# Patient Record
Sex: Female | Born: 2000 | Race: White | Hispanic: No | Marital: Single | Smoking: Never smoker
Health system: Southern US, Community
[De-identification: ages and names within clinical notes are randomized; demographics above are authoritative.]

## PROBLEM LIST (undated history)

## (undated) ENCOUNTER — Inpatient Hospital Stay: Payer: Self-pay

## (undated) DIAGNOSIS — F99 Mental disorder, not otherwise specified: Secondary | ICD-10-CM

## (undated) DIAGNOSIS — G540 Brachial plexus disorders: Secondary | ICD-10-CM

## (undated) DIAGNOSIS — F319 Bipolar disorder, unspecified: Secondary | ICD-10-CM

## (undated) DIAGNOSIS — K589 Irritable bowel syndrome without diarrhea: Secondary | ICD-10-CM

## (undated) DIAGNOSIS — G589 Mononeuropathy, unspecified: Secondary | ICD-10-CM

## (undated) DIAGNOSIS — F419 Anxiety disorder, unspecified: Secondary | ICD-10-CM

## (undated) DIAGNOSIS — F32A Depression, unspecified: Secondary | ICD-10-CM

## (undated) DIAGNOSIS — F329 Major depressive disorder, single episode, unspecified: Secondary | ICD-10-CM

## (undated) DIAGNOSIS — K219 Gastro-esophageal reflux disease without esophagitis: Secondary | ICD-10-CM

## (undated) HISTORY — DX: Anxiety disorder, unspecified: F41.9

## (undated) HISTORY — PX: TONSILLECTOMY: SUR1361

## (undated) HISTORY — DX: Mental disorder, not otherwise specified: F99

## (undated) HISTORY — DX: Depression, unspecified: F32.A

## (undated) HISTORY — PX: FIRST RIB REMOVAL: SHX642

## (undated) HISTORY — DX: Major depressive disorder, single episode, unspecified: F32.9

---
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
## 2019-01-04 ENCOUNTER — Other Ambulatory Visit: Payer: Self-pay

## 2019-01-04 ENCOUNTER — Encounter: Payer: Self-pay | Admitting: Obstetrics and Gynecology

## 2019-01-04 ENCOUNTER — Ambulatory Visit (INDEPENDENT_AMBULATORY_CARE_PROVIDER_SITE_OTHER): Payer: Medicaid Other | Admitting: Obstetrics and Gynecology

## 2019-01-04 VITALS — BP 97/67 | HR 71 | Ht 61.0 in | Wt 155.1 lb

## 2019-01-04 DIAGNOSIS — Z30430 Encounter for insertion of intrauterine contraceptive device: Secondary | ICD-10-CM | POA: Diagnosis not present

## 2019-01-04 NOTE — Addendum Note (Signed)
Addended by: Durwin Glaze on: 01/04/2019 03:19 PM   Modules accepted: Orders

## 2019-01-04 NOTE — Addendum Note (Signed)
Addended by: Jackline Castilla J on: 01/04/2019 03:18 PM   Modules accepted: Orders  

## 2019-01-04 NOTE — Progress Notes (Signed)
HPI:      [name] is a 18 y.o. [phone] who LMP was No LMP recorded.  Subjective:   She presents today for IUD insertion.  She continues to pump and breast-feed.  She has not had a period.    Hx: The following portions of the patient's history were reviewed and updated as appropriate:             She  has a past medical history of Anxiety, Depression, GERD (gastroesophageal reflux disease), Irritable bowel syndrome (IBS) (since childhood), Mental disorder, Pinched nerve (ongoing), and Thoracic outlet syndrome. She does not have any pertinent problems on file. She  has a past surgical history that includes First rib removal; Tonsillectomy; Cesarean section (N/A, 02/09/2017); and Cesarean section (N/A, 11/22/2018). Her family history includes Healthy in her mother; Heart failure in her father; Hypertension in her father; Stroke in her father. She  reports that she has never smoked. She has never used smokeless tobacco. She reports that she does not drink alcohol or use drugs. She has a current medication list which includes the following prescription(s): vitafol gummies. She is allergic to vicodin [hydrocodone-acetaminophen] and amoxicillin.       Review of Systems:  Review of Systems  Constitutional: Denied constitutional symptoms, night sweats, recent illness, fatigue, fever, insomnia and weight loss.  Eyes: Denied eye symptoms, eye pain, photophobia, vision change and visual disturbance.  Ears/Nose/Throat/Neck: Denied ear, nose, throat or neck symptoms, hearing loss, nasal discharge, sinus congestion and sore throat.  Cardiovascular: Denied cardiovascular symptoms, arrhythmia, chest pain/pressure, edema, exercise intolerance, orthopnea and palpitations.  Respiratory: Denied pulmonary symptoms, asthma, pleuritic pain, productive sputum, cough, dyspnea and wheezing.  Gastrointestinal: Denied, gastro-esophageal reflux, melena, nausea and vomiting.  Genitourinary: Denied  genitourinary symptoms including symptomatic vaginal discharge, pelvic relaxation issues, and urinary complaints.  Musculoskeletal: Denied musculoskeletal symptoms, stiffness, swelling, muscle weakness and myalgia.  Dermatologic: Denied dermatology symptoms, rash and scar.  Neurologic: Denied neurology symptoms, dizziness, headache, neck pain and syncope.  Psychiatric: Denied psychiatric symptoms, anxiety and depression.  Endocrine: Denied endocrine symptoms including hot flashes and night sweats.   Meds:   Current Outpatient Medications on File Prior to Visit  Medication Sig Dispense Refill  . Prenatal Vit-Fe Phos-FA-Omega (VITAFOL GUMMIES) 3.33-0.333-34.8 MG CHEW Chew 3 capsules by mouth daily. 90 tablet 10   No current facility-administered medications on file prior to visit.    Objective:     Vitals:   01/04/19 1442  BP: 97/67  Pulse: 71    Physical examination   Pelvic:   Vulva: Normal appearance.  No lesions.  Vagina: No lesions or abnormalities noted.  Support: Normal pelvic support.  Urethra No masses tenderness or scarring.  Meatus Normal size without lesions or prolapse.  Cervix: Normal appearance.  No lesions.  Anus: Normal exam.  No lesions.  Perineum: Normal exam.  No lesions.        Bimanual   Uterus: Normal size.  Non-tender.  Mobile.  Retroverted  Adnexae: No masses.  Non-tender to palpation.  Cul-de-sac: Negative for abnormality.   IUD Procedure Pt has read the booklet and signed the appropriate forms regarding the Mirena IUD.  All of her questions have been answered.   The cervix was cleansed with betadine solution.  After sounding the uterus and noting the position, the IUD was placed in the usual manner without problem.  The string was cut to the appropriate length.  The patient tolerated the procedure well.  Assessment:    [id number] Patient Active Problem List   Diagnosis Date Noted  . Cesarean delivery delivered 11/22/2018  .  Indication for care in labor or delivery 10/24/2018  . Indication for care/intervention related to labor/delivery, antepartum 08/29/2018  . Prior fetal macrosomia, antepartum 06/16/2018  . Rubella non-immune status, antepartum 06/16/2018  . History of cesarean delivery 06/16/2018  . Post-dates pregnancy 02/09/2017  . Pregnancy 12/01/2016  . Bipolar affective disorder in remission (HCC) 08/19/2016  . Severe episode of recurrent major depressive disorder, without psychotic features (HCC)   . Insomnia 06/27/2015  . MDD (major depressive disorder) 06/25/2015     1. Encounter for insertion of mirena IUD       Plan:             F/U  Return in about 4 weeks (around 02/01/2019) for For IUD f/u.  Elonda Husky, M.D. 01/04/2019 3:07 PM

## 2019-01-04 NOTE — Addendum Note (Signed)
Addended by: Durwin Glaze on: 01/04/2019 03:18 PM   Modules accepted: Orders

## 2019-01-12 DIAGNOSIS — R102 Pelvic and perineal pain: Secondary | ICD-10-CM

## 2019-01-12 MED ORDER — TRAMADOL HCL 50 MG PO TABS: 50.0000 mg | ORAL_TABLET | Freq: Four times a day (QID) | ORAL | 0 refills | Status: DC | PRN

## 2019-01-12 NOTE — Telephone Encounter (Signed)
Pt returned called from nurse. Pt is requesting a call back. Please advise

## 2019-02-01 ENCOUNTER — Encounter: Payer: Medicaid Other | Admitting: Obstetrics and Gynecology

## 2019-02-01 ENCOUNTER — Other Ambulatory Visit: Payer: Medicaid Other

## 2019-02-07 ENCOUNTER — Encounter: Payer: Self-pay | Admitting: Surgical

## 2019-02-07 NOTE — Telephone Encounter (Signed)
Pt would like a call from [place].

## 2019-02-23 ENCOUNTER — Ambulatory Visit (INDEPENDENT_AMBULATORY_CARE_PROVIDER_SITE_OTHER): Payer: Medicaid Other | Admitting: Obstetrics and Gynecology

## 2019-02-23 ENCOUNTER — Other Ambulatory Visit: Payer: Self-pay

## 2019-02-23 ENCOUNTER — Encounter: Payer: Self-pay | Admitting: Obstetrics and Gynecology

## 2019-02-23 VITALS — BP 113/75 | HR 86 | Ht 61.0 in | Wt 146.0 lb

## 2019-02-23 DIAGNOSIS — Z30011 Encounter for initial prescription of contraceptive pills: Secondary | ICD-10-CM | POA: Diagnosis not present

## 2019-02-23 DIAGNOSIS — Z3009 Encounter for other general counseling and advice on contraception: Secondary | ICD-10-CM | POA: Diagnosis not present

## 2019-02-23 DIAGNOSIS — Z30432 Encounter for removal of intrauterine contraceptive device: Secondary | ICD-10-CM | POA: Diagnosis not present

## 2019-02-23 MED ORDER — LO LOESTRIN FE 1 MG-10 MCG / 10 MCG PO TABS: 1.0000 | ORAL_TABLET | Freq: Every day | ORAL | 1 refills | Status: DC

## 2019-02-23 NOTE — Progress Notes (Signed)
HPI:      [name] is a 19 y.o. [phone] who LMP was No LMP recorded.  Subjective:   She presents today for IUD removal.  She states she has been bleeding almost every day since insertion.  She is unhappy with this birth control would like to try something else.  She is questions about different methods of birth control.    Hx: The following portions of the patient's history were reviewed and updated as appropriate:             She  has a past medical history of Anxiety, Depression, GERD (gastroesophageal reflux disease), Irritable bowel syndrome (IBS) (since childhood), Mental disorder, Pinched nerve (ongoing), and Thoracic outlet syndrome. She does not have any pertinent problems on file. She  has a past surgical history that includes First rib removal; Tonsillectomy; Cesarean section (N/A, 02/09/2017); and Cesarean section (N/A, 11/22/2018). Her family history includes Healthy in her mother; Heart failure in her father; Hypertension in her father; Stroke in her father. She  reports that she has never smoked. She has never used smokeless tobacco. She reports that she does not drink alcohol or use drugs. She has a current medication list which includes the following prescription(s): lo loestrin fe, vitafol gummies, and tramadol. She is allergic to vicodin [hydrocodone-acetaminophen]; amoxicillin; and tramadol.       Review of Systems:  Review of Systems  Constitutional: Denied constitutional symptoms, night sweats, recent illness, fatigue, fever, insomnia and weight loss.  Eyes: Denied eye symptoms, eye pain, photophobia, vision change and visual disturbance.  Ears/Nose/Throat/Neck: Denied ear, nose, throat or neck symptoms, hearing loss, nasal discharge, sinus congestion and sore throat.  Cardiovascular: Denied cardiovascular symptoms, arrhythmia, chest pain/pressure, edema, exercise intolerance, orthopnea and palpitations.  Respiratory: Denied pulmonary symptoms, asthma,  pleuritic pain, productive sputum, cough, dyspnea and wheezing.  Gastrointestinal: Denied, gastro-esophageal reflux, melena, nausea and vomiting.  Genitourinary: Denied genitourinary symptoms including symptomatic vaginal discharge, pelvic relaxation issues, and urinary complaints.  Musculoskeletal: Denied musculoskeletal symptoms, stiffness, swelling, muscle weakness and myalgia.  Dermatologic: Denied dermatology symptoms, rash and scar.  Neurologic: Denied neurology symptoms, dizziness, headache, neck pain and syncope.  Psychiatric: Denied psychiatric symptoms, anxiety and depression.  Endocrine: Denied endocrine symptoms including hot flashes and night sweats.   Meds:   Current Outpatient Medications on File Prior to Visit  Medication Sig Dispense Refill  . Prenatal Vit-Fe Phos-FA-Omega (VITAFOL GUMMIES) 3.33-0.333-34.8 MG CHEW Chew 3 capsules by mouth daily. 90 tablet 10  . traMADol (ULTRAM) 50 MG tablet Take 1 tablet (50 mg total) by mouth every 6 (six) hours as needed. 20 tablet 0   No current facility-administered medications on file prior to visit.    Objective:     Vitals:   02/23/19 1438  BP: 113/75  Pulse: 86              IUD Removal Strings of IUD identified and grasped.  IUD removed without problem.  Pt tolerated this well.  IUD noted to be intact.     Assessment:    [id number] Patient Active Problem List   Diagnosis Date Noted  . Cesarean delivery delivered 11/22/2018  . Indication for care in labor or delivery 10/24/2018  . Indication for care/intervention related to labor/delivery, antepartum 08/29/2018  . Prior fetal macrosomia, antepartum 06/16/2018  . Rubella non-immune status, antepartum 06/16/2018  . History of cesarean delivery 06/16/2018  . Post-dates pregnancy 02/09/2017  . Pregnancy 12/01/2016  . Bipolar affective disorder in remission (Jaclyn Kelly)  08/19/2016  . Severe episode of recurrent major depressive disorder, without psychotic features (HCC)   .  Insomnia 06/27/2015  . MDD (major depressive disorder) 06/25/2015     1. Encounter for IUD removal   2. Initiation of OCP (BCP)   3. Birth control counseling        Plan:            1.  Birth Control I discussed multiple birth control options and methods with the patient.  The risks and benefits of each were reviewed. OCPs The risks /benefits of OCPs have been explained to the patient in detail.  Product literature has been given to her where appropriate.  I have instructed her in the use of OCPs.  I have explained to the patient that OCPs are not as effective for birth control during the first month of use, and that another form of contraception should be used during this time.  Both first-day start and Sunday start have been explained.  The risks and benefits of each was discussed.  She has been made aware of  the fact that in rare circumstances, other medications may affect the efficacy of OCPs.  I have answered all of her questions, and I believe that she has an understanding of the effectiveness and use of OCPs. She has decided on OCPs as a method of birth control.  We will start lo Loestrin Orders No orders of the defined types were placed in this encounter.    Meds ordered this encounter  Medications  . Norethindrone-Ethinyl Estradiol-Fe Biphas (LO LOESTRIN FE) 1 MG-10 MCG / 10 MCG tablet    Sig: Take 1 tablet by mouth at bedtime for 28 days.    Dispense:  3 Package    Refill:  1      F/U  No follow-ups on file. I spent 21 minutes involved in the care of this patient preparing to see the patient by obtaining and reviewing her medical history (including labs, imaging tests and prior procedures), documenting clinical information in the electronic health record (EHR), counseling and coordinating care plans, writing and sending prescriptions, ordering tests or procedures and directly communicating with the patient by discussing pertinent items from her history and physical exam as  well as detailing my assessment and plan as noted above so that she has an informed understanding.  All of her questions were answered.  Jaclyn Kelly, M.D. 02/23/2019 2:57 PM

## 2019-02-28 ENCOUNTER — Encounter: Payer: Self-pay | Admitting: Surgical

## 2019-03-01 ENCOUNTER — Encounter: Payer: Self-pay | Admitting: Obstetrics and Gynecology

## 2019-03-01 ENCOUNTER — Other Ambulatory Visit: Payer: Self-pay

## 2019-03-01 ENCOUNTER — Ambulatory Visit (INDEPENDENT_AMBULATORY_CARE_PROVIDER_SITE_OTHER): Payer: Medicaid Other | Admitting: Obstetrics and Gynecology

## 2019-03-01 ENCOUNTER — Ambulatory Visit (INDEPENDENT_AMBULATORY_CARE_PROVIDER_SITE_OTHER): Payer: Medicaid Other

## 2019-03-01 VITALS — BP 114/78 | HR 99 | Ht 61.0 in | Wt 145.0 lb

## 2019-03-01 DIAGNOSIS — N941 Unspecified dyspareunia: Secondary | ICD-10-CM

## 2019-03-01 DIAGNOSIS — R102 Pelvic and perineal pain: Secondary | ICD-10-CM

## 2019-03-01 NOTE — Progress Notes (Signed)
HPI:      [name] is a 19 y.o. [phone] who LMP was No LMP recorded. (Menstrual status: Oral contraceptives).  Subjective:   She presents today stating that she continues to experience pelvic pain especially left-sided.  It is especially bad with intercourse.  She is taking OCPs as directed and has noted some spotting but no heavy bleeding.  Her main concern is with her pelvic pain.    Hx: The following portions of the patient's history were reviewed and updated as appropriate:             She  has a past medical history of Anxiety, Depression, GERD (gastroesophageal reflux disease), Irritable bowel syndrome (IBS) (since childhood), Mental disorder, Pinched nerve (ongoing), and Thoracic outlet syndrome. She does not have any pertinent problems on file. She  has a past surgical history that includes First rib removal; Tonsillectomy; Cesarean section (N/A, 02/09/2017); and Cesarean section (N/A, 11/22/2018). Her family history includes Healthy in her mother; Heart failure in her father; Hypertension in her father; Stroke in her father. She  reports that she has never smoked. She has never used smokeless tobacco. She reports that she does not drink alcohol or use drugs. She has a current medication list which includes the following prescription(s): lo loestrin fe. She is allergic to vicodin [hydrocodone-acetaminophen]; amoxicillin; and tramadol.       Review of Systems:  Review of Systems  Constitutional: Denied constitutional symptoms, night sweats, recent illness, fatigue, fever, insomnia and weight loss.  Eyes: Denied eye symptoms, eye pain, photophobia, vision change and visual disturbance.  Ears/Nose/Throat/Neck: Denied ear, nose, throat or neck symptoms, hearing loss, nasal discharge, sinus congestion and sore throat.  Cardiovascular: Denied cardiovascular symptoms, arrhythmia, chest pain/pressure, edema, exercise intolerance, orthopnea and palpitations.  Respiratory: Denied  pulmonary symptoms, asthma, pleuritic pain, productive sputum, cough, dyspnea and wheezing.  Gastrointestinal: Denied, gastro-esophageal reflux, melena, nausea and vomiting.  Genitourinary: See HPI for additional information.  Musculoskeletal: Denied musculoskeletal symptoms, stiffness, swelling, muscle weakness and myalgia.  Dermatologic: Denied dermatology symptoms, rash and scar.  Neurologic: Denied neurology symptoms, dizziness, headache, neck pain and syncope.  Psychiatric: Denied psychiatric symptoms, anxiety and depression.  Endocrine: Denied endocrine symptoms including hot flashes and night sweats.   Meds:   Current Outpatient Medications on File Prior to Visit  Medication Sig Dispense Refill  . Norethindrone-Ethinyl Estradiol-Fe Biphas (LO LOESTRIN FE) 1 MG-10 MCG / 10 MCG tablet Take 1 tablet by mouth at bedtime for 28 days. 3 Package 1   No current facility-administered medications on file prior to visit.    Objective:     Vitals:   03/01/19 1418  BP: 114/78  Pulse: 99                Assessment:    [id number] Patient Active Problem List   Diagnosis Date Noted  . Cesarean delivery delivered 11/22/2018  . Indication for care in labor or delivery 10/24/2018  . Indication for care/intervention related to labor/delivery, antepartum 08/29/2018  . Prior fetal macrosomia, antepartum 06/16/2018  . Rubella non-immune status, antepartum 06/16/2018  . History of cesarean delivery 06/16/2018  . Post-dates pregnancy 02/09/2017  . Pregnancy 12/01/2016  . Bipolar affective disorder in remission (HCC) 08/19/2016  . Severe episode of recurrent major depressive disorder, without psychotic features (HCC)   . Insomnia 06/27/2015  . MDD (major depressive disorder) 06/25/2015     1. Pelvic pain in female   2. Dyspareunia in female     Patient  has pelvic pain but mostly pain with intercourse.  Left-sided.   Plan:            1.  Ultrasound to rule out ovarian cyst or other  pelvic pathology.  Patient will continue OCPs. Orders Orders Placed This Encounter  Procedures  . US PELVIS (TRANSABDOMINAL ONLY)    No orders of the defined types were placed in this encounter.     F/U  No follow-ups on file. I spent 21 minutes involved in the care of this patient preparing to see the patient by obtaining and reviewing her medical history (including labs, imaging tests and prior procedures), documenting clinical information in the electronic health record (EHR), counseling and coordinating care plans, writing and sending prescriptions, ordering tests or procedures and directly communicating with the patient by discussing pertinent items from her history and physical exam as well as detailing my assessment and plan as noted above so that she has an informed understanding.  All of her questions were answered.  Finis Bud, M.D. 03/01/2019 2:43 PM

## 2019-03-03 ENCOUNTER — Encounter: Payer: Medicaid Other | Admitting: Obstetrics and Gynecology

## 2019-03-03 ENCOUNTER — Other Ambulatory Visit: Payer: Medicaid Other

## 2019-06-15 ENCOUNTER — Encounter: Payer: Self-pay | Admitting: Obstetrics and Gynecology

## 2019-06-15 ENCOUNTER — Other Ambulatory Visit: Payer: Self-pay

## 2019-06-15 ENCOUNTER — Ambulatory Visit (INDEPENDENT_AMBULATORY_CARE_PROVIDER_SITE_OTHER): Payer: Medicaid Other | Admitting: Obstetrics and Gynecology

## 2019-06-15 VITALS — BP 105/67 | HR 105 | Ht 61.0 in | Wt 133.7 lb

## 2019-06-15 DIAGNOSIS — O09299 Supervision of pregnancy with other poor reproductive or obstetric history, unspecified trimester: Secondary | ICD-10-CM

## 2019-06-15 DIAGNOSIS — N912 Amenorrhea, unspecified: Secondary | ICD-10-CM

## 2019-06-15 DIAGNOSIS — O34219 Maternal care for unspecified type scar from previous cesarean delivery: Secondary | ICD-10-CM

## 2019-06-15 LAB — POCT URINE PREGNANCY: Preg Test, Ur: POSITIVE — AB

## 2019-06-15 MED ORDER — VITAFOL GUMMIES 3.33-0.333-34.8 MG PO CHEW: CHEWABLE_TABLET | ORAL | 11 refills | Status: DC

## 2019-06-15 MED ORDER — DOXYLAMINE-PYRIDOXINE 10-10 MG PO TBEC: 2.0000 | DELAYED_RELEASE_TABLET | Freq: Every day | ORAL | 5 refills | Status: DC

## 2019-06-15 NOTE — Progress Notes (Signed)
HPI:      [name] is a 19 y.o. [phone] who LMP was Patient's last menstrual period was 05/16/2019.  Subjective:   She presents today after missing her menstrual period. She is approximately 4 weeks estimated gestational age. She has not yet begun prenatal vitamins. She states that she went off of OCPs purposefully to get pregnant. She has occasional nausea without vomiting. She says her appetite is decreased. Patient has a history of 2 prior cesarean deliveries. She says that she is considering an attempt at "having the baby natural this time". Of significant note patient had her first cesarean delivery for nonreassuring fetal heart rate tracing and her second cesarean delivery was a scheduled repeat. Her largest baby weighed 9 pounds 10 ounces.    Hx: The following portions of the patient's history were reviewed and updated as appropriate:             She  has a past medical history of Anxiety, Depression, GERD (gastroesophageal reflux disease), Irritable bowel syndrome (IBS) (since childhood), Mental disorder, Pinched nerve (ongoing), and Thoracic outlet syndrome. She does not have any pertinent problems on file. She  has a past surgical history that includes First rib removal; Tonsillectomy; Cesarean section (N/A, 02/09/2017); and Cesarean section (N/A, 11/22/2018). Her family history includes Healthy in her mother; Heart failure in her father; Hypertension in her father; Stroke in her father. She  reports that she has never smoked. She has never used smokeless tobacco. She reports that she does not drink alcohol or use drugs. She has a current medication list which includes the following prescription(s): doxylamine-pyridoxine and vitafol gummies. She is allergic to vicodin [hydrocodone-acetaminophen]; amoxicillin; and tramadol.       Review of Systems:  Review of Systems  Constitutional: Denied constitutional symptoms, night sweats, recent illness, fatigue, fever, insomnia and  weight loss.  Eyes: Denied eye symptoms, eye pain, photophobia, vision change and visual disturbance.  Ears/Nose/Throat/Neck: Denied ear, nose, throat or neck symptoms, hearing loss, nasal discharge, sinus congestion and sore throat.  Cardiovascular: Denied cardiovascular symptoms, arrhythmia, chest pain/pressure, edema, exercise intolerance, orthopnea and palpitations.  Respiratory: Denied pulmonary symptoms, asthma, pleuritic pain, productive sputum, cough, dyspnea and wheezing.  Gastrointestinal: Denied, gastro-esophageal reflux, melena, nausea and vomiting.  Genitourinary: Denied genitourinary symptoms including symptomatic vaginal discharge, pelvic relaxation issues, and urinary complaints.  Musculoskeletal: Denied musculoskeletal symptoms, stiffness, swelling, muscle weakness and myalgia.  Dermatologic: Denied dermatology symptoms, rash and scar.  Neurologic: Denied neurology symptoms, dizziness, headache, neck pain and syncope.  Psychiatric: Denied psychiatric symptoms, anxiety and depression.  Endocrine: Denied endocrine symptoms including hot flashes and night sweats.   Meds:   No current outpatient medications on file prior to visit.   No current facility-administered medications on file prior to visit.    Objective:     Vitals:   06/15/19 1424  BP: 105/67  Pulse: (!) 105              Urinary pregnancy test positive  Assessment:    [id number] Patient Active Problem List   Diagnosis Date Noted  . Cesarean delivery delivered 11/22/2018  . Indication for care in labor or delivery 10/24/2018  . Indication for care/intervention related to labor/delivery, antepartum 08/29/2018  . Prior fetal macrosomia, antepartum 06/16/2018  . Rubella non-immune status, antepartum 06/16/2018  . History of cesarean delivery 06/16/2018  . Post-dates pregnancy 02/09/2017  . Pregnancy 12/01/2016  . Bipolar affective disorder in remission ([place]) 08/19/2016  . Severe episode of recurrent major  depressive disorder, without psychotic features (HCC)   . Insomnia 06/27/2015  . MDD (major depressive disorder) 06/25/2015     1. Amenorrhea   2. Previous cesarean delivery, antepartum   3. History of macrosomia in infant in prior pregnancy, currently pregnant        Plan:            Prenatal Plan 1.  The patient was given prenatal literature. 2.  She was continued on prenatal vitamins. 3.  A prenatal lab panel was ordered or drawn. 4.  An ultrasound was ordered to better determine an EDC. 5.  A nurse visit was scheduled. 6.  Genetic testing and testing for other inheritable conditions discussed in detail. She will decide in the future whether to have these labs performed. 7.  A general overview of pregnancy testing, visit schedule, ultrasound schedule, and prenatal care was discussed. 8.  COVID and its risks associated with pregnancy, prevention by limiting exposure and use of masks, as well as the risks and benefits of vaccination during pregnancy were discussed in detail.  [facility] policy regarding office and hospital visitation and testing was explained. 9.  Benefits of breast-feeding discussed in detail including both maternal and infant benefits. Ready Set Baby website discussed. 10. TOLAC discussed-risk benefits reviewed patient will continue to consider this option throughout her pregnancy. Possible ultrasound later in pregnancy for fetal size which could affect this decision. 11. Because of her history of macrosomia consider 18-week 1 hour GCT.   Orders Orders Placed This Encounter  Procedures  . POCT urine pregnancy     Meds ordered this encounter  Medications  . Prenatal Vit-Fe Phos-FA-Omega (VITAFOL GUMMIES) 3.33-0.333-34.8 MG CHEW    Sig: Take three gummies every morning.    Dispense:  90 tablet    Refill:  11  . Doxylamine-Pyridoxine (DICLEGIS) 10-10 MG TBEC    Sig: Take 2 tablets by mouth at bedtime. If symptoms persist, add one tablet in the morning and one in  the afternoon    Dispense:  100 tablet    Refill:  5      F/U  Return in about 4 weeks (around 07/13/2019).  Elonda Husky, M.D. 06/15/2019 2:49 PM

## 2019-06-22 ENCOUNTER — Other Ambulatory Visit: Payer: Self-pay

## 2019-06-22 ENCOUNTER — Emergency Department
Admission: EM | Admit: 2019-06-22 | Discharge: 2019-06-22 | Discharge status: Against Medical Advice | Payer: Medicaid Other | Attending: Emergency Medicine | Admitting: Emergency Medicine

## 2019-06-22 ENCOUNTER — Encounter: Payer: Self-pay | Admitting: *Deleted

## 2019-06-22 DIAGNOSIS — Z5321 Procedure and treatment not carried out due to patient leaving prior to being seen by health care provider: Secondary | ICD-10-CM | POA: Diagnosis not present

## 2019-06-22 DIAGNOSIS — R1030 Lower abdominal pain, unspecified: Secondary | ICD-10-CM | POA: Insufficient documentation

## 2019-06-22 LAB — COMPREHENSIVE METABOLIC PANEL
ALT: 15 U/L (ref 0–44)
AST: 15 U/L (ref 15–41)
Albumin: 4.4 g/dL (ref 3.5–5.0)
Alkaline Phosphatase: 76 U/L (ref 38–126)
Anion gap: 8 (ref 5–15)
BUN: 9 mg/dL (ref 6–20)
CO2: 24 mmol/L (ref 22–32)
Calcium: 8.8 mg/dL — ABNORMAL LOW (ref 8.9–10.3)
Chloride: 106 mmol/L (ref 98–111)
Creatinine, Ser: 0.65 mg/dL (ref 0.44–1.00)
GFR calc Af Amer: >60 mL/min (ref >60)
GFR calc non Af Amer: >60 mL/min (ref >60)
Glucose, Bld: 74 mg/dL (ref 70–99)
Potassium: 3.6 mmol/L (ref 3.5–5.1)
Sodium: 138 mmol/L (ref 135–145)
Total Bilirubin: 0.5 mg/dL (ref 0.3–1.2)
Total Protein: 7.1 g/dL (ref 6.5–8.1)

## 2019-06-22 LAB — CBC
HCT: 39.9 % (ref 36.0–46.0)
Hemoglobin: 13.1 g/dL (ref 12.0–15.0)
MCH: 27.2 pg (ref 26.0–34.0)
MCHC: 32.8 g/dL (ref 30.0–36.0)
MCV: 83 fL (ref 80.0–100.0)
Platelets: 205 10*3/uL (ref 150–400)
RBC: 4.81 MIL/uL (ref 3.87–5.11)
RDW: 13.2 % (ref 11.5–15.5)
WBC: 10.7 10*3/uL — ABNORMAL HIGH (ref 4.0–10.5)
nRBC: 0 % (ref 0.0–0.2)

## 2019-06-22 LAB — URINALYSIS, COMPLETE (UACMP) WITH MICROSCOPIC
Bacteria, UA: NONE SEEN
Bilirubin Urine: NEGATIVE
Glucose, UA: NEGATIVE mg/dL
Hgb urine dipstick: NEGATIVE
Ketones, ur: NEGATIVE mg/dL
Nitrite: NEGATIVE
Protein, ur: 30 mg/dL — AB
Specific Gravity, Urine: 1.036 — ABNORMAL HIGH (ref 1.005–1.030)
pH: 5 (ref 5.0–8.0)

## 2019-06-22 LAB — HCG, QUANTITATIVE, PREGNANCY: hCG, Beta Chain, Quant, S: 11423 m[IU]/mL — ABNORMAL HIGH (ref <5)

## 2019-06-22 LAB — LIPASE, BLOOD: Lipase: 26 U/L (ref 11–51)

## 2019-06-22 NOTE — ED Triage Notes (Signed)
Pt has lower abd pain and lower back pain  Pt is apporx [redacted] weeks pregnant. No vag bleeding.  Denies urinary sx.  Pt has nausea.  Pt alert  Speech clear.

## 2019-06-29 ENCOUNTER — Ambulatory Visit (INDEPENDENT_AMBULATORY_CARE_PROVIDER_SITE_OTHER): Payer: Medicaid Other

## 2019-06-29 ENCOUNTER — Other Ambulatory Visit: Payer: Self-pay | Admitting: Obstetrics and Gynecology

## 2019-06-29 ENCOUNTER — Other Ambulatory Visit: Payer: Self-pay

## 2019-06-29 DIAGNOSIS — Z789 Other specified health status: Secondary | ICD-10-CM

## 2019-06-29 DIAGNOSIS — Z3A01 Less than 8 weeks gestation of pregnancy: Secondary | ICD-10-CM

## 2019-07-08 ENCOUNTER — Other Ambulatory Visit: Payer: Self-pay

## 2019-07-08 ENCOUNTER — Ambulatory Visit (INDEPENDENT_AMBULATORY_CARE_PROVIDER_SITE_OTHER): Payer: Medicaid Other | Admitting: Surgical

## 2019-07-08 VITALS — BP 118/83 | HR 85 | Ht 61.0 in | Wt 129.3 lb

## 2019-07-08 DIAGNOSIS — Z3491 Encounter for supervision of normal pregnancy, unspecified, first trimester: Secondary | ICD-10-CM | POA: Diagnosis not present

## 2019-07-08 MED ORDER — ONDANSETRON 4 MG PO TBDP: 4.0000 mg | ORAL_TABLET | Freq: Four times a day (QID) | ORAL | 0 refills | Status: DC | PRN

## 2019-07-08 NOTE — Progress Notes (Signed)
Jaclyn Kelly presents for NOB nurse interview visit. Pregnancy confirmation done 06/15/19. G5. [id number]. Pregnancy education material explained and given. 1 cats in home. NOB labs ordered. TSH/HbgA1c ordered due to BMI 30 or greater. Sickle cell ordered due to patient's race. HIV labs and drug screen were explained and ordered. PNV encouraged. Genetic screening options discussed. Genetic testing: Unsure. Patient may discuss with the provider. Patient to follow up with provider on 08/10/19  weeks for NOB physical. All questions answered.

## 2019-07-09 LAB — URINALYSIS, ROUTINE W REFLEX MICROSCOPIC
Bilirubin, UA: NEGATIVE
Glucose, UA: NEGATIVE
Nitrite, UA: NEGATIVE
Specific Gravity, UA: >=1.03 — AB (ref 1.005–1.030)
Urobilinogen, Ur: 1 mg/dL (ref 0.2–1.0)
pH, UA: 5.5 (ref 5.0–7.5)

## 2019-07-09 LAB — MICROSCOPIC EXAMINATION: Epithelial Cells (non renal): >10 /hpf — AB (ref 0–10)

## 2019-07-09 LAB — HEPATITIS B SURFACE ANTIGEN: Hepatitis B Surface Ag: NEGATIVE

## 2019-07-09 LAB — RPR: RPR Ser Ql: NONREACTIVE

## 2019-07-09 LAB — ABO AND RH: Rh Factor: POSITIVE

## 2019-07-09 LAB — RUBELLA SCREEN: Rubella Antibodies, IGG: 2.55 index (ref >0.99)

## 2019-07-09 LAB — HIV ANTIBODY (ROUTINE TESTING W REFLEX): HIV Screen 4th Generation wRfx: NONREACTIVE

## 2019-07-09 LAB — VARICELLA ZOSTER ANTIBODY, IGG: Varicella zoster IgG: 770 index (ref >165)

## 2019-07-09 LAB — ANTIBODY SCREEN: Antibody Screen: NEGATIVE

## 2019-07-11 LAB — GC/CHLAMYDIA PROBE AMP
Chlamydia trachomatis, NAA: NEGATIVE
Neisseria Gonorrhoeae by PCR: NEGATIVE

## 2019-07-12 LAB — URINE CULTURE, OB REFLEX

## 2019-07-12 LAB — CULTURE, OB URINE

## 2019-07-14 ENCOUNTER — Other Ambulatory Visit: Payer: Self-pay | Admitting: Surgical

## 2019-07-14 MED ORDER — NITROFURANTOIN MONOHYD MACRO 100 MG PO CAPS
100.0000 mg | ORAL_CAPSULE | Freq: Two times a day (BID) | ORAL | 0 refills | Status: DC
Start: 2019-07-14 — End: 2019-08-05

## 2019-07-20 LAB — MONITOR DRUG PROFILE 14(MW)
Amphetamine Scrn, Ur: NEGATIVE ng/mL
BARBITURATE SCREEN URINE: NEGATIVE ng/mL
BENZODIAZEPINE SCREEN, URINE: NEGATIVE ng/mL
Buprenorphine, Urine: NEGATIVE ng/mL
Cocaine (Metab) Scrn, Ur: NEGATIVE ng/mL
Creatinine(Crt), U: 472.7 mg/dL — ABNORMAL HIGH (ref 20.0–300.0)
Fentanyl, Urine: NEGATIVE pg/mL
Meperidine Screen, Urine: NEGATIVE ng/mL
Methadone Screen, Urine: NEGATIVE ng/mL
Ph of Urine: 6 (ref 4.5–8.9)
Phencyclidine Qn, Ur: NEGATIVE ng/mL
Propoxyphene Scrn, Ur: NEGATIVE ng/mL
SPECIFIC GRAVITY: 1.02
Tramadol Screen, Urine: NEGATIVE ng/mL

## 2019-07-20 LAB — NICOTINE SCREEN, URINE: Cotinine Ql Scrn, Ur: POSITIVE ng/mL — AB

## 2019-07-20 LAB — OPIATES CONFIRMATION, URINE
Codeine: NEGATIVE
HYDROCODONE CONFIRM: 392 ng/mL
Hydrocodone: POSITIVE — AB
Hydromorphone Confirm: 308 ng/mL
Hydromorphone: POSITIVE — AB
Morphine: NEGATIVE
Opiates: POSITIVE ng/mL — AB

## 2019-07-20 LAB — OXYCODONE/OXYMORPHONE CONFIRM
OXYCODONE/OXYMORPH: POSITIVE — AB
OXYCODONE: >3000 ng/mL
OXYCODONE: POSITIVE — AB
OXYMORPHONE CONFIRM: >3000 ng/mL
OXYMORPHONE: POSITIVE — AB

## 2019-07-20 LAB — CANNABINOID (GC/MS), URINE
Cannabinoid: POSITIVE — AB
Carboxy THC (GC/MS): 23 ng/mL

## 2019-08-05 ENCOUNTER — Other Ambulatory Visit: Payer: Self-pay

## 2019-08-05 ENCOUNTER — Ambulatory Visit (INDEPENDENT_AMBULATORY_CARE_PROVIDER_SITE_OTHER): Payer: Medicaid Other | Admitting: Obstetrics and Gynecology

## 2019-08-05 ENCOUNTER — Encounter: Payer: Self-pay | Admitting: Obstetrics and Gynecology

## 2019-08-05 VITALS — BP 105/69 | HR 71 | Ht 61.0 in | Wt 125.5 lb

## 2019-08-05 DIAGNOSIS — O0380 Unspecified complication following complete or unspecified spontaneous abortion: Secondary | ICD-10-CM

## 2019-08-05 DIAGNOSIS — O036 Delayed or excessive hemorrhage following complete or unspecified spontaneous abortion: Secondary | ICD-10-CM | POA: Diagnosis not present

## 2019-08-05 DIAGNOSIS — R102 Pelvic and perineal pain: Secondary | ICD-10-CM

## 2019-08-05 DIAGNOSIS — O0330 Unspecified complication following incomplete spontaneous abortion: Secondary | ICD-10-CM | POA: Diagnosis not present

## 2019-08-05 DIAGNOSIS — R319 Hematuria, unspecified: Secondary | ICD-10-CM

## 2019-08-05 DIAGNOSIS — N3001 Acute cystitis with hematuria: Secondary | ICD-10-CM | POA: Diagnosis not present

## 2019-08-05 LAB — POCT URINALYSIS DIPSTICK
Bilirubin, UA: NEGATIVE
Glucose, UA: NEGATIVE
Ketones, UA: NEGATIVE
Leukocytes, UA: NEGATIVE
Nitrite, UA: NEGATIVE
Protein, UA: NEGATIVE
Spec Grav, UA: 1.01 (ref 1.010–1.025)
Urobilinogen, UA: 0.2 E.U./dL
pH, UA: 6.5 (ref 5.0–8.0)

## 2019-08-05 MED ORDER — NITROFURANTOIN MONOHYD MACRO 100 MG PO CAPS: 100.0000 mg | ORAL_CAPSULE | Freq: Two times a day (BID) | ORAL | 1 refills | Status: DC

## 2019-08-05 MED ORDER — CEPHALEXIN 500 MG PO CAPS
500.0000 mg | ORAL_CAPSULE | Freq: Four times a day (QID) | ORAL | 2 refills | Status: DC
Start: 2019-08-05 — End: 2019-09-30

## 2019-08-05 MED ORDER — DOXYCYCLINE HYCLATE 100 MG PO CAPS: 100.0000 mg | ORAL_CAPSULE | Freq: Two times a day (BID) | ORAL | 0 refills | Status: AC

## 2019-08-05 NOTE — Progress Notes (Signed)
HPI:      [name] is a 19 y.o. [id number] who LMP was Patient's last menstrual period was 05/16/2019.  Subjective:   She presents today with complaint of persistent vaginal bleeding with small clots (quarter to half dollar size 3 times per day) since her abortion 2 weeks ago.  She states that the first few days she had bleeding afterward but on about the fourth day her bleeding became very heavy and she passed a large clot the size of her hand.  Since that time her bleeding has decreased but she continues to experience passage of quarter size clots.  Passage of clots is accompanied by midline abdominal cramping.  Patient is also states that she has only been able to urinate 1-2 times per day and it is not a normal stream for her. Patient resumed intercourse after her abortion.    Hx: The following portions of the patient's history were reviewed and updated as appropriate:             She  has a past medical history of Anxiety, Depression, GERD (gastroesophageal reflux disease), Irritable bowel syndrome (IBS) (since childhood), Mental disorder, Pinched nerve (ongoing), and Thoracic outlet syndrome. She does not have any pertinent problems on file. She  has a past surgical history that includes First rib removal; Tonsillectomy; Cesarean section (N/A, 02/09/2017); and Cesarean section (N/A, 11/22/2018). Her family history includes Healthy in her mother; Heart failure in her father; Hypertension in her father; Stroke in her father. She  reports that she has never smoked. She has never used smokeless tobacco. She reports that she does not drink alcohol and does not use drugs. She has a current medication list which includes the following prescription(s): suboxone, cephalexin, doxycycline, and nitrofurantoin (macrocrystal-monohydrate). She is allergic to vicodin [hydrocodone-acetaminophen], amoxicillin, and tramadol.       Review of Systems:  Review of Systems  Constitutional: Denied  constitutional symptoms, night sweats, recent illness, fatigue, fever, insomnia and weight loss.  Eyes: Denied eye symptoms, eye pain, photophobia, vision change and visual disturbance.  Ears/Nose/Throat/Neck: Denied ear, nose, throat or neck symptoms, hearing loss, nasal discharge, sinus congestion and sore throat.  Cardiovascular: Denied cardiovascular symptoms, arrhythmia, chest pain/pressure, edema, exercise intolerance, orthopnea and palpitations.  Respiratory: Denied pulmonary symptoms, asthma, pleuritic pain, productive sputum, cough, dyspnea and wheezing.  Gastrointestinal: Denied, gastro-esophageal reflux, melena, nausea and vomiting.  Genitourinary: See HPI for additional information.  Musculoskeletal: Denied musculoskeletal symptoms, stiffness, swelling, muscle weakness and myalgia.  Dermatologic: Denied dermatology symptoms, rash and scar.  Neurologic: Denied neurology symptoms, dizziness, headache, neck pain and syncope.  Psychiatric: Denied psychiatric symptoms, anxiety and depression.  Endocrine: Denied endocrine symptoms including hot flashes and night sweats.   Meds:   Current Outpatient Medications on File Prior to Visit  Medication Sig Dispense Refill  . SUBOXONE 8-2 MG FILM Place 1 Film under the tongue daily.     No current facility-administered medications on file prior to visit.    Objective:     Vitals:   08/05/19 1103  BP: 105/69  Pulse: 71              Physical examination   Pelvic:   Vulva: Normal appearance.  No lesions.  Vagina: No lesions or abnormalities noted.  Minimal blood noted in the vagina  Support: Normal pelvic support.  Urethra No masses tenderness or scarring.  Meatus Normal size without lesions or prolapse.  Cervix: Normal appearance.  No lesions.  Minimal blood coming from  the cervical os.  Os is closed.  Anus: Normal exam.  No lesions.  Perineum: Normal exam.  No lesions.        Bimanual   Uterus: Normal size.  Moderately tender to  palpation.  mobile.  AV.  Adnexae: No masses.  Non-tender to palpation.  Cul-de-sac: Negative for abnormality.   UA performed but only positive for blood-very little urine present not enough to send for culture. (Essentially undiagnostic)  Assessment:    [id number] Patient Active Problem List   Diagnosis Date Noted  . Cesarean delivery delivered 11/22/2018  . Indication for care in labor or delivery 10/24/2018  . Indication for care/intervention related to labor/delivery, antepartum 08/29/2018  . Prior fetal macrosomia, antepartum 06/16/2018  . Rubella non-immune status, antepartum 06/16/2018  . History of cesarean delivery 06/16/2018  . Post-dates pregnancy 02/09/2017  . Pregnancy 12/01/2016  . Bipolar affective disorder in remission (HCC) 08/19/2016  . Severe episode of recurrent major depressive disorder, without psychotic features (HCC)   . Insomnia 06/27/2015  . MDD (major depressive disorder) 06/25/2015     1. Post-abortion complication   2. Hematuria, unspecified type   3. Pelvic pain in female   4. Acute cystitis with hematuria     I think based on the patient's symptoms of hesitancy with urination and her low-grade temperature she may have a bladder infection.  We will presumptively treat with antibiotics  With some uterine tenderness and more than expected bleeding status post abortion endometritis is a possibility.   Plan:            1.  We will presumptively treat for endometritis using Keflex and doxycycline.    2.  Treat for UTI with Macrobid  3.  As ultrasound not readily available and cervical os closed with uterus small will presume no retained products.  If patient continues to be symptomatic through the weekend despite antibiotics will perform ultrasound early next week and possible D&E as needed.  Orders Orders Placed This Encounter  Procedures  . POCT urinalysis dipstick     Meds ordered this encounter  Medications  . cephALEXin (KEFLEX) 500 MG  capsule    Sig: Take 1 capsule (500 mg total) by mouth 4 (four) times daily.    Dispense:  28 capsule    Refill:  2  . doxycycline (VIBRAMYCIN) 100 MG capsule    Sig: Take 1 capsule (100 mg total) by mouth 2 (two) times daily for 7 days.    Dispense:  14 capsule    Refill:  0  . nitrofurantoin, macrocrystal-monohydrate, (MACROBID) 100 MG capsule    Sig: Take 1 capsule (100 mg total) by mouth 2 (two) times daily.    Dispense:  14 capsule    Refill:  1      F/U  Return for Pt to contact us if symptoms worsen. I spent 25 minutes involved in the care of this patient preparing to see the patient by obtaining and reviewing her medical history (including labs, imaging tests and prior procedures), documenting clinical information in the electronic health record (EHR), counseling and coordinating care plans, writing and sending prescriptions, ordering tests or procedures and directly communicating with the patient by discussing pertinent items from her history and physical exam as well as detailing my assessment and plan as noted above so that she has an informed understanding.  All of her questions were answered.  Elonda Husky, M.D. 08/05/2019 12:03 PM

## 2019-08-08 ENCOUNTER — Other Ambulatory Visit: Payer: Self-pay | Admitting: Surgical

## 2019-08-08 DIAGNOSIS — O0380 Unspecified complication following complete or unspecified spontaneous abortion: Secondary | ICD-10-CM

## 2019-08-09 ENCOUNTER — Ambulatory Visit (INDEPENDENT_AMBULATORY_CARE_PROVIDER_SITE_OTHER): Payer: Medicaid Other | Admitting: Obstetrics and Gynecology

## 2019-08-09 ENCOUNTER — Ambulatory Visit (INDEPENDENT_AMBULATORY_CARE_PROVIDER_SITE_OTHER): Payer: Medicaid Other

## 2019-08-09 ENCOUNTER — Encounter: Payer: Self-pay | Admitting: Obstetrics and Gynecology

## 2019-08-09 VITALS — BP 109/73 | HR 94 | Ht 61.0 in | Wt 124.1 lb

## 2019-08-09 DIAGNOSIS — O0380 Unspecified complication following complete or unspecified spontaneous abortion: Secondary | ICD-10-CM

## 2019-08-09 DIAGNOSIS — O034 Incomplete spontaneous abortion without complication: Secondary | ICD-10-CM | POA: Diagnosis not present

## 2019-08-09 DIAGNOSIS — Z01818 Encounter for other preprocedural examination: Secondary | ICD-10-CM | POA: Diagnosis not present

## 2019-08-09 NOTE — H&P (View-Only) (Signed)
PRE-OPERATIVE HISTORY AND PHYSICAL EXAM  PCP:  Patient, No Pcp Per Subjective:   HPI:  Jaclyn Kelly is a 19 y.o. [id number].  Patient's last menstrual period was 05/16/2019.  She presents today for a pre-op discussion and PE.  She has the following symptoms: Continues to have irregular and intermittent bleeding after abortion.  She took her antibiotics and she no longer has symptoms of bladder infection.  Review of Systems:   Constitutional: Denied constitutional symptoms, night sweats, recent illness, fatigue, fever, insomnia and weight loss.  Eyes: Denied eye symptoms, eye pain, photophobia, vision change and visual disturbance.  Ears/Nose/Throat/Neck: Denied ear, nose, throat or neck symptoms, hearing loss, nasal discharge, sinus congestion and sore throat.  Cardiovascular: Denied cardiovascular symptoms, arrhythmia, chest pain/pressure, edema, exercise intolerance, orthopnea and palpitations.  Respiratory: Denied pulmonary symptoms, asthma, pleuritic pain, productive sputum, cough, dyspnea and wheezing.  Gastrointestinal: Denied, gastro-esophageal reflux, melena, nausea and vomiting.  Genitourinary: See HPI for additional information.  Musculoskeletal: Denied musculoskeletal symptoms, stiffness, swelling, muscle weakness and myalgia.  Dermatologic: Denied dermatology symptoms, rash and scar.  Neurologic: Denied neurology symptoms, dizziness, headache, neck pain and syncope.  Psychiatric: Denied psychiatric symptoms, anxiety and depression.  Endocrine: Denied endocrine symptoms including hot flashes and night sweats.   OB History  Gravida Para Term Preterm AB Living  5 2 2  0 2 2  SAB TAB Ectopic Multiple Live Births  1 0 1 1 2     # Outcome Date GA Lbr Len/2nd Weight Sex Delivery Anes PTL Lv  5 Gravida           4A Ectopic 01/27/18 [redacted]w[redacted]d         4B Term 11/22/18 [redacted]w[redacted]d  9 lb 3.8 oz (4.19 kg) F CS-LTranv Spinal  LIV  3 Term 02/09/17 [redacted]w[redacted]d  9 lb 10.5 oz (4.38 kg) M  CS-LTranv EPI  LIV  2 SAB 03/2015 [redacted]w[redacted]d       ND  1 Gravida             Past Medical History:  Diagnosis Date  . Anxiety   . Depression   . GERD (gastroesophageal reflux disease)    NO MEDS  . Irritable bowel syndrome (IBS) since childhood  . Mental disorder    Bipolar/ Manic Depression  . Pinched nerve ongoing   in foot, with inflammation  . Thoracic outlet syndrome     Past Surgical History:  Procedure Laterality Date  . CESAREAN SECTION N/A 02/09/2017   Procedure: CESAREAN SECTION;  Surgeon: [redacted]w[redacted]d, MD;  Location: [facility];  Service: Obstetrics;  Laterality: N/A;  . CESAREAN SECTION N/A 11/22/2018   Procedure: CESAREAN SECTION REPEAT;  Surgeon: Linzie Collin, MD;  Location: [facility];  Service: Obstetrics;  Laterality: N/A;  . FIRST RIB REMOVAL    . TONSILLECTOMY        SOCIAL HISTORY: Social History   Tobacco Use  Smoking Status Never Smoker  Smokeless Tobacco Never Used   Social History   Substance and Sexual Activity  Alcohol Use No   Social History   Substance and Sexual Activity  Drug Use No    Family History  Problem Relation Age of Onset  . Hypertension Father   . Stroke Father   . Heart failure Father   . Healthy Mother     ALLERGIES:  Vicodin [hydrocodone-acetaminophen], Amoxicillin, and Tramadol  MEDS:   Current Outpatient Medications on File Prior to Visit  Medication Sig Dispense Refill  .  cephALEXin (KEFLEX) 500 MG capsule Take 1 capsule (500 mg total) by mouth 4 (four) times daily. 28 capsule 2  . doxycycline (VIBRAMYCIN) 100 MG capsule Take 1 capsule (100 mg total) by mouth 2 (two) times daily for 7 days. 14 capsule 0  . nitrofurantoin, macrocrystal-monohydrate, (MACROBID) 100 MG capsule Take 1 capsule (100 mg total) by mouth 2 (two) times daily. 14 capsule 1  . SUBOXONE 8-2 MG FILM Place 1 Film under the tongue daily.     No current facility-administered medications on file prior to visit.    No orders of the defined  types were placed in this encounter.    Physical examination BP 109/73   Pulse 94   Ht 5\' 1"  (1.549 m)   Wt 124 lb 1.6 oz (56.3 kg)   LMP 05/16/2019   BMI 23.45 kg/m   General NAD, Conversant  HEENT Atraumatic; Op clear with mmm.  Normo-cephalic. Pupils reactive. Anicteric sclerae  Thyroid/Neck Smooth without nodularity or enlargement. Normal ROM.  Neck Supple.  Skin No rashes, lesions or ulceration. Normal palpated skin turgor. No nodularity.  Breasts: No masses or discharge.  Symmetric.  No axillary adenopathy.  Lungs: Clear to auscultation.No rales or wheezes. Normal Respiratory effort, no retractions.  Heart: NSR.  No murmurs or rubs appreciated. No periferal edema  Abdomen: Soft.  Non-tender.  No masses.  No HSM. No hernia  Extremities: Moves all appropriately.  Normal ROM for age. No lymphadenopathy.  Neuro: Oriented to PPT.  Normal mood. Normal affect.   07/16/2019: The Endometrium measures 19 mm. Heterogeneous material within the endometrial cavity ranging from the fundal aspect that measures 19 mm,  with-in the  lower body portion of endometrium measuring 10 mm.  Assessment:   [place] Patient Active Problem List   Diagnosis Date Noted  . Cesarean delivery delivered 11/22/2018  . Indication for care in labor or delivery 10/24/2018  . Indication for care/intervention related to labor/delivery, antepartum 08/29/2018  . Prior fetal macrosomia, antepartum 06/16/2018  . Rubella non-immune status, antepartum 06/16/2018  . History of cesarean delivery 06/16/2018  . Post-dates pregnancy 02/09/2017  . Pregnancy 12/01/2016  . Bipolar affective disorder in remission (HCC) 08/19/2016  . Severe episode of recurrent major depressive disorder, without psychotic features (HCC)   . Insomnia 06/27/2015  . MDD (major depressive disorder) 06/25/2015    1. Preop examination   2. Incomplete abortion    Ultrasound shows retained products of conception likely the source of her continued  bleeding.   Plan:   Orders: No orders of the defined types were placed in this encounter.    1.  D&E tomorrow   Pre-op discussions regarding Risks and Benefits of her scheduled surgery.  D&E The procedure and the risks and benefits of dilation and curettage/evacuation have been explained to the patient.  The specific risks of bleeding, infection, anesthesia, uterine perforation, and damage to bowel or bladder  have been specifically discussed.  I have answered all of her questions and I believe that she has an adequate and informed understanding of this procedure.

## 2019-08-09 NOTE — Progress Notes (Addendum)
PRE-OPERATIVE HISTORY AND PHYSICAL EXAM  PCP:  Patient, No Pcp Per Subjective:   HPI:  Jaclyn Kelly is a 19 y.o. [id number].  Patient's last menstrual period was 05/16/2019.  She presents today for a pre-op discussion and PE.  She has the following symptoms: Continues to have irregular and intermittent bleeding after abortion.  She took her antibiotics and she no longer has symptoms of bladder infection.  Review of Systems:   Constitutional: Denied constitutional symptoms, night sweats, recent illness, fatigue, fever, insomnia and weight loss.  Eyes: Denied eye symptoms, eye pain, photophobia, vision change and visual disturbance.  Ears/Nose/Throat/Neck: Denied ear, nose, throat or neck symptoms, hearing loss, nasal discharge, sinus congestion and sore throat.  Cardiovascular: Denied cardiovascular symptoms, arrhythmia, chest pain/pressure, edema, exercise intolerance, orthopnea and palpitations.  Respiratory: Denied pulmonary symptoms, asthma, pleuritic pain, productive sputum, cough, dyspnea and wheezing.  Gastrointestinal: Denied, gastro-esophageal reflux, melena, nausea and vomiting.  Genitourinary: See HPI for additional information.  Musculoskeletal: Denied musculoskeletal symptoms, stiffness, swelling, muscle weakness and myalgia.  Dermatologic: Denied dermatology symptoms, rash and scar.  Neurologic: Denied neurology symptoms, dizziness, headache, neck pain and syncope.  Psychiatric: Denied psychiatric symptoms, anxiety and depression.  Endocrine: Denied endocrine symptoms including hot flashes and night sweats.   OB History  Gravida Para Term Preterm AB Living  5 2 2  0 2 2  SAB TAB Ectopic Multiple Live Births  1 0 1 1 2     # Outcome Date GA Lbr Len/2nd Weight Sex Delivery Anes PTL Lv  5 Gravida           4A Ectopic 01/27/18 [redacted]w[redacted]d         4B Term 11/22/18 [redacted]w[redacted]d  9 lb 3.8 oz (4.19 kg) F CS-LTranv Spinal  LIV  3 Term 02/09/17 [redacted]w[redacted]d  9 lb 10.5 oz (4.38 kg) M  CS-LTranv EPI  LIV  2 SAB 03/2015 [redacted]w[redacted]d       ND  1 Gravida             Past Medical History:  Diagnosis Date  . Anxiety   . Depression   . GERD (gastroesophageal reflux disease)    NO MEDS  . Irritable bowel syndrome (IBS) since childhood  . Mental disorder    Bipolar/ Manic Depression  . Pinched nerve ongoing   in foot, with inflammation  . Thoracic outlet syndrome     Past Surgical History:  Procedure Laterality Date  . CESAREAN SECTION N/A 02/09/2017   Procedure: CESAREAN SECTION;  Surgeon: [redacted]w[redacted]d, MD;  Location: [facility];  Service: Obstetrics;  Laterality: N/A;  . CESAREAN SECTION N/A 11/22/2018   Procedure: CESAREAN SECTION REPEAT;  Surgeon: Linzie Collin, MD;  Location: [facility];  Service: Obstetrics;  Laterality: N/A;  . FIRST RIB REMOVAL    . TONSILLECTOMY        SOCIAL HISTORY: Social History   Tobacco Use  Smoking Status Never Smoker  Smokeless Tobacco Never Used   Social History   Substance and Sexual Activity  Alcohol Use No   Social History   Substance and Sexual Activity  Drug Use No    Family History  Problem Relation Age of Onset  . Hypertension Father   . Stroke Father   . Heart failure Father   . Healthy Mother     ALLERGIES:  Vicodin [hydrocodone-acetaminophen], Amoxicillin, and Tramadol  MEDS:   Current Outpatient Medications on File Prior to Visit  Medication Sig Dispense Refill  .  cephALEXin (KEFLEX) 500 MG capsule Take 1 capsule (500 mg total) by mouth 4 (four) times daily. 28 capsule 2  . doxycycline (VIBRAMYCIN) 100 MG capsule Take 1 capsule (100 mg total) by mouth 2 (two) times daily for 7 days. 14 capsule 0  . nitrofurantoin, macrocrystal-monohydrate, (MACROBID) 100 MG capsule Take 1 capsule (100 mg total) by mouth 2 (two) times daily. 14 capsule 1  . SUBOXONE 8-2 MG FILM Place 1 Film under the tongue daily.     No current facility-administered medications on file prior to visit.    No orders of the defined  types were placed in this encounter.    Physical examination BP 109/73   Pulse 94   Ht 5\' 1"  (1.549 m)   Wt 124 lb 1.6 oz (56.3 kg)   LMP 05/16/2019   BMI 23.45 kg/m   General NAD, Conversant  HEENT Atraumatic; Op clear with mmm.  Normo-cephalic. Pupils reactive. Anicteric sclerae  Thyroid/Neck Smooth without nodularity or enlargement. Normal ROM.  Neck Supple.  Skin No rashes, lesions or ulceration. Normal palpated skin turgor. No nodularity.  Breasts: No masses or discharge.  Symmetric.  No axillary adenopathy.  Lungs: Clear to auscultation.No rales or wheezes. Normal Respiratory effort, no retractions.  Heart: NSR.  No murmurs or rubs appreciated. No periferal edema  Abdomen: Soft.  Non-tender.  No masses.  No HSM. No hernia  Extremities: Moves all appropriately.  Normal ROM for age. No lymphadenopathy.  Neuro: Oriented to PPT.  Normal mood. Normal affect.   07/16/2019: The Endometrium measures 19 mm. Heterogeneous material within the endometrial cavity ranging from the fundal aspect that measures 19 mm,  with-in the  lower body portion of endometrium measuring 10 mm.  Assessment:   [place] Patient Active Problem List   Diagnosis Date Noted  . Cesarean delivery delivered 11/22/2018  . Indication for care in labor or delivery 10/24/2018  . Indication for care/intervention related to labor/delivery, antepartum 08/29/2018  . Prior fetal macrosomia, antepartum 06/16/2018  . Rubella non-immune status, antepartum 06/16/2018  . History of cesarean delivery 06/16/2018  . Post-dates pregnancy 02/09/2017  . Pregnancy 12/01/2016  . Bipolar affective disorder in remission (HCC) 08/19/2016  . Severe episode of recurrent major depressive disorder, without psychotic features (HCC)   . Insomnia 06/27/2015  . MDD (major depressive disorder) 06/25/2015    1. Preop examination   2. Incomplete abortion    Ultrasound shows retained products of conception likely the source of her continued  bleeding.   Plan:   Orders: No orders of the defined types were placed in this encounter.    1.  D&E tomorrow   Pre-op discussions regarding Risks and Benefits of her scheduled surgery.  D&E The procedure and the risks and benefits of dilation and curettage/evacuation have been explained to the patient.  The specific risks of bleeding, infection, anesthesia, uterine perforation, and damage to bowel or bladder  have been specifically discussed.  I have answered all of her questions and I believe that she has an adequate and informed understanding of this procedure.   I spent 33 minutes involved in the care of this patient preparing to see the patient by obtaining and reviewing her medical history (including labs, imaging tests and prior procedures), documenting clinical information in the electronic health record (EHR), counseling and coordinating care plans, writing and sending prescriptions, ordering tests or procedures and directly communicating with the patient by discussing pertinent items from her history and physical exam as well as detailing my  assessment and plan as noted above so that she has an informed understanding.  All of her questions were answered.  Elonda Husky, M.D. 08/09/2019 4:01 PM

## 2019-08-09 NOTE — H&P (Signed)
PRE-OPERATIVE HISTORY AND PHYSICAL EXAM  PCP:  Patient, No Pcp Per Subjective:   HPI:  Jaclyn Kelly is a 19 y.o. [id number].  Patient's last menstrual period was 05/16/2019.  She presents today for a pre-op discussion and PE.  She has the following symptoms: Continues to have irregular and intermittent bleeding after abortion.  She took her antibiotics and she no longer has symptoms of bladder infection.  Review of Systems:   Constitutional: Denied constitutional symptoms, night sweats, recent illness, fatigue, fever, insomnia and weight loss.  Eyes: Denied eye symptoms, eye pain, photophobia, vision change and visual disturbance.  Ears/Nose/Throat/Neck: Denied ear, nose, throat or neck symptoms, hearing loss, nasal discharge, sinus congestion and sore throat.  Cardiovascular: Denied cardiovascular symptoms, arrhythmia, chest pain/pressure, edema, exercise intolerance, orthopnea and palpitations.  Respiratory: Denied pulmonary symptoms, asthma, pleuritic pain, productive sputum, cough, dyspnea and wheezing.  Gastrointestinal: Denied, gastro-esophageal reflux, melena, nausea and vomiting.  Genitourinary: See HPI for additional information.  Musculoskeletal: Denied musculoskeletal symptoms, stiffness, swelling, muscle weakness and myalgia.  Dermatologic: Denied dermatology symptoms, rash and scar.  Neurologic: Denied neurology symptoms, dizziness, headache, neck pain and syncope.  Psychiatric: Denied psychiatric symptoms, anxiety and depression.  Endocrine: Denied endocrine symptoms including hot flashes and night sweats.   OB History  Gravida Para Term Preterm AB Living  5 2 2  0 2 2  SAB TAB Ectopic Multiple Live Births  1 0 1 1 2     # Outcome Date GA Lbr Len/2nd Weight Sex Delivery Anes PTL Lv  5 Gravida           4A Ectopic 01/27/18 [redacted]w[redacted]d         4B Term 11/22/18 [redacted]w[redacted]d  9 lb 3.8 oz (4.19 kg) F CS-LTranv Spinal  LIV  3 Term 02/09/17 [redacted]w[redacted]d  9 lb 10.5 oz (4.38 kg) M  CS-LTranv EPI  LIV  2 SAB 03/2015 [redacted]w[redacted]d       ND  1 Gravida             Past Medical History:  Diagnosis Date  . Anxiety   . Depression   . GERD (gastroesophageal reflux disease)    NO MEDS  . Irritable bowel syndrome (IBS) since childhood  . Mental disorder    Bipolar/ Manic Depression  . Pinched nerve ongoing   in foot, with inflammation  . Thoracic outlet syndrome     Past Surgical History:  Procedure Laterality Date  . CESAREAN SECTION N/A 02/09/2017   Procedure: CESAREAN SECTION;  Surgeon: [redacted]w[redacted]d, MD;  Location: [facility] OR[facility];  Service: Obstetrics;  Laterality: N/A;  . CESAREAN SECTION N/A 11/22/2018   Procedure: CESAREAN SECTION REPEAT;  Surgeon: Linzie Collin, MD;  Location: [facility] OR[facility];  Service: Obstetrics;  Laterality: N/A;  . FIRST RIB REMOVAL    . TONSILLECTOMY        SOCIAL HISTORY: Social History   Tobacco Use  Smoking Status Never Smoker  Smokeless Tobacco Never Used   Social History   Substance and Sexual Activity  Alcohol Use No   Social History   Substance and Sexual Activity  Drug Use No    Family History  Problem Relation Age of Onset  . Hypertension Father   . Stroke Father   . Heart failure Father   . Healthy Mother     ALLERGIES:  Vicodin [hydrocodone-acetaminophen], Amoxicillin, and Tramadol  MEDS:   Current Outpatient Medications on File Prior to Visit  Medication Sig Dispense Refill  .  cephALEXin (KEFLEX) 500 MG capsule Take 1 capsule (500 mg total) by mouth 4 (four) times daily. 28 capsule 2  . doxycycline (VIBRAMYCIN) 100 MG capsule Take 1 capsule (100 mg total) by mouth 2 (two) times daily for 7 days. 14 capsule 0  . nitrofurantoin, macrocrystal-monohydrate, (MACROBID) 100 MG capsule Take 1 capsule (100 mg total) by mouth 2 (two) times daily. 14 capsule 1  . SUBOXONE 8-2 MG FILM Place 1 Film under the tongue daily.     No current facility-administered medications on file prior to visit.    No orders of the defined  types were placed in this encounter.    Physical examination BP 109/73   Pulse 94   Ht 5\' 1"  (1.549 m)   Wt 124 lb 1.6 oz (56.3 kg)   LMP 05/16/2019   BMI 23.45 kg/m   General NAD, Conversant  HEENT Atraumatic; Op clear with mmm.  Normo-cephalic. Pupils reactive. Anicteric sclerae  Thyroid/Neck Smooth without nodularity or enlargement. Normal ROM.  Neck Supple.  Skin No rashes, lesions or ulceration. Normal palpated skin turgor. No nodularity.  Breasts: No masses or discharge.  Symmetric.  No axillary adenopathy.  Lungs: Clear to auscultation.No rales or wheezes. Normal Respiratory effort, no retractions.  Heart: NSR.  No murmurs or rubs appreciated. No periferal edema  Abdomen: Soft.  Non-tender.  No masses.  No HSM. No hernia  Extremities: Moves all appropriately.  Normal ROM for age. No lymphadenopathy.  Neuro: Oriented to PPT.  Normal mood. Normal affect.   07/16/2019: The Endometrium measures 19 mm. Heterogeneous material within the endometrial cavity ranging from the fundal aspect that measures 19 mm,  with-in the  lower body portion of endometrium measuring 10 mm.  Assessment:   [place] Patient Active Problem List   Diagnosis Date Noted  . Cesarean delivery delivered 11/22/2018  . Indication for care in labor or delivery 10/24/2018  . Indication for care/intervention related to labor/delivery, antepartum 08/29/2018  . Prior fetal macrosomia, antepartum 06/16/2018  . Rubella non-immune status, antepartum 06/16/2018  . History of cesarean delivery 06/16/2018  . Post-dates pregnancy 02/09/2017  . Pregnancy 12/01/2016  . Bipolar affective disorder in remission (HCC) 08/19/2016  . Severe episode of recurrent major depressive disorder, without psychotic features (HCC)   . Insomnia 06/27/2015  . MDD (major depressive disorder) 06/25/2015    1. Preop examination   2. Incomplete abortion    Ultrasound shows retained products of conception likely the source of her continued  bleeding.   Plan:   Orders: No orders of the defined types were placed in this encounter.    1.  D&E tomorrow   Pre-op discussions regarding Risks and Benefits of her scheduled surgery.  D&E The procedure and the risks and benefits of dilation and curettage/evacuation have been explained to the patient.  The specific risks of bleeding, infection, anesthesia, uterine perforation, and damage to bowel or bladder  have been specifically discussed.  I have answered all of her questions and I believe that she has an adequate and informed understanding of this procedure.

## 2019-08-10 ENCOUNTER — Other Ambulatory Visit
Admission: RE | Admit: 2019-08-10 | Discharge: 2019-08-10 | Disposition: A | Discharge status: Home / Self Care | Payer: Medicaid Other | Source: Ambulatory Visit | Attending: Obstetrics and Gynecology | Admitting: Obstetrics and Gynecology

## 2019-08-10 ENCOUNTER — Ambulatory Visit: Payer: Medicaid Other | Admitting: Anesthesiology

## 2019-08-10 ENCOUNTER — Other Ambulatory Visit: Payer: Self-pay

## 2019-08-10 ENCOUNTER — Ambulatory Visit
Admission: RE | Admit: 2019-08-10 | Discharge: 2019-08-10 | Disposition: A | Discharge status: Home / Self Care | Payer: Medicaid Other | Attending: Obstetrics and Gynecology | Admitting: Obstetrics and Gynecology

## 2019-08-10 ENCOUNTER — Encounter: Payer: Self-pay | Admitting: Obstetrics and Gynecology

## 2019-08-10 ENCOUNTER — Encounter: Payer: Medicaid Other | Admitting: Obstetrics and Gynecology

## 2019-08-10 ENCOUNTER — Encounter
Admission: RE | Disposition: A | Discharge status: Home / Self Care | Payer: Self-pay | Source: Home / Self Care | Attending: Obstetrics and Gynecology

## 2019-08-10 ENCOUNTER — Other Ambulatory Visit: Payer: Medicaid Other

## 2019-08-10 DIAGNOSIS — U071 COVID-19: Secondary | ICD-10-CM | POA: Diagnosis not present

## 2019-08-10 DIAGNOSIS — F419 Anxiety disorder, unspecified: Secondary | ICD-10-CM | POA: Insufficient documentation

## 2019-08-10 DIAGNOSIS — O034 Incomplete spontaneous abortion without complication: Secondary | ICD-10-CM | POA: Diagnosis not present

## 2019-08-10 DIAGNOSIS — K219 Gastro-esophageal reflux disease without esophagitis: Secondary | ICD-10-CM | POA: Diagnosis not present

## 2019-08-10 DIAGNOSIS — K589 Irritable bowel syndrome without diarrhea: Secondary | ICD-10-CM | POA: Insufficient documentation

## 2019-08-10 DIAGNOSIS — F319 Bipolar disorder, unspecified: Secondary | ICD-10-CM | POA: Diagnosis not present

## 2019-08-10 HISTORY — PX: DILATION AND EVACUATION: SHX1459

## 2019-08-10 LAB — URINE DRUG SCREEN, QUALITATIVE (ARMC ONLY)
Amphetamines, Ur Screen: NOT DETECTED
Barbiturates, Ur Screen: NOT DETECTED
Benzodiazepine, Ur Scrn: POSITIVE — AB
Cannabinoid 50 Ng, Ur ~~LOC~~: NOT DETECTED
Cocaine Metabolite,Ur ~~LOC~~: NOT DETECTED
MDMA (Ecstasy)Ur Screen: NOT DETECTED
Methadone Scn, Ur: NOT DETECTED
Opiate, Ur Screen: NOT DETECTED
Phencyclidine (PCP) Ur S: NOT DETECTED
Tricyclic, Ur Screen: NOT DETECTED

## 2019-08-10 LAB — TYPE AND SCREEN
ABO/RH(D): O POS
Antibody Screen: NEGATIVE

## 2019-08-10 LAB — CBC
HCT: 36.6 % (ref 36.0–46.0)
Hemoglobin: 12.2 g/dL (ref 12.0–15.0)
MCH: 27.9 pg (ref 26.0–34.0)
MCHC: 33.3 g/dL (ref 30.0–36.0)
MCV: 83.6 fL (ref 80.0–100.0)
Platelets: 107 10*3/uL — ABNORMAL LOW (ref 150–400)
RBC: 4.38 MIL/uL (ref 3.87–5.11)
RDW: 13.2 % (ref 11.5–15.5)
WBC: 3 10*3/uL — ABNORMAL LOW (ref 4.0–10.5)
nRBC: 0 % (ref 0.0–0.2)

## 2019-08-10 LAB — SARS CORONAVIRUS 2 BY RT PCR (HOSPITAL ORDER, PERFORMED IN ~~LOC~~ HOSPITAL LAB): SARS Coronavirus 2: POSITIVE — AB

## 2019-08-10 SURGERY — DILATION AND EVACUATION, UTERUS
Anesthesia: General

## 2019-08-10 MED ORDER — OXYTOCIN-SODIUM CHLORIDE 30-0.9 UT/500ML-% IV SOLN
INTRAVENOUS | Status: DC | PRN
  Administered 2019-08-10: 10 [IU] via INTRAVENOUS

## 2019-08-10 MED ORDER — ACETAMINOPHEN 10 MG/ML IV SOLN
INTRAVENOUS | Status: DC | PRN
  Administered 2019-08-10: 1000 mg via INTRAVENOUS

## 2019-08-10 MED ORDER — FENTANYL CITRATE (PF) 100 MCG/2ML IJ SOLN
INTRAMUSCULAR | Status: DC | PRN
  Administered 2019-08-10: 50 ug via INTRAVENOUS

## 2019-08-10 MED ORDER — LIDOCAINE HCL (PF) 2 % IJ SOLN
INTRAMUSCULAR | Status: AC
  Filled 2019-08-10: qty 5

## 2019-08-10 MED ORDER — IBUPROFEN 800 MG PO TABS
800.0000 mg | ORAL_TABLET | Freq: Three times a day (TID) | ORAL | 0 refills | Status: DC | PRN
Start: 2019-08-10 — End: 2020-02-01

## 2019-08-10 MED ORDER — ONDANSETRON HCL 4 MG/2ML IJ SOLN: 4.0000 mg | Freq: Once | INTRAMUSCULAR | Status: DC | PRN

## 2019-08-10 MED ORDER — SUCCINYLCHOLINE CHLORIDE 20 MG/ML IJ SOLN
INTRAMUSCULAR | Status: DC | PRN
  Administered 2019-08-10: 80 mg via INTRAVENOUS

## 2019-08-10 MED ORDER — LACTATED RINGERS IV SOLN: INTRAVENOUS | Status: DC

## 2019-08-10 MED ORDER — MIDAZOLAM HCL 2 MG/2ML IJ SOLN
INTRAMUSCULAR | Status: AC
  Filled 2019-08-10: qty 2

## 2019-08-10 MED ORDER — KETOROLAC TROMETHAMINE 30 MG/ML IJ SOLN: 30.0000 mg | Freq: Once | INTRAMUSCULAR | Status: DC

## 2019-08-10 MED ORDER — POVIDONE-IODINE 10 % EX SWAB
2.0000 "application " | Freq: Once | CUTANEOUS | Status: AC
  Administered 2019-08-10: 2 via TOPICAL

## 2019-08-10 MED ORDER — ONDANSETRON HCL 4 MG/2ML IJ SOLN: 4.0000 mg | Freq: Four times a day (QID) | INTRAMUSCULAR | Status: DC | PRN

## 2019-08-10 MED ORDER — ONDANSETRON 4 MG PO TBDP: 4.0000 mg | ORAL_TABLET | Freq: Four times a day (QID) | ORAL | Status: DC | PRN

## 2019-08-10 MED ORDER — OXYTOCIN 10 UNIT/ML IJ SOLN
INTRAMUSCULAR | Status: AC
  Filled 2019-08-10: qty 2

## 2019-08-10 MED ORDER — DEXAMETHASONE SODIUM PHOSPHATE 10 MG/ML IJ SOLN
INTRAMUSCULAR | Status: AC
  Filled 2019-08-10: qty 1

## 2019-08-10 MED ORDER — ORAL CARE MOUTH RINSE: 15.0000 mL | Freq: Once | OROMUCOSAL | Status: AC

## 2019-08-10 MED ORDER — ACETAMINOPHEN 10 MG/ML IV SOLN
INTRAVENOUS | Status: AC
  Filled 2019-08-10: qty 100

## 2019-08-10 MED ORDER — FENTANYL CITRATE (PF) 100 MCG/2ML IJ SOLN: 25.0000 ug | INTRAMUSCULAR | Status: DC | PRN

## 2019-08-10 MED ORDER — ONDANSETRON HCL 4 MG/2ML IJ SOLN
INTRAMUSCULAR | Status: AC
  Filled 2019-08-10: qty 2

## 2019-08-10 MED ORDER — FENTANYL CITRATE (PF) 100 MCG/2ML IJ SOLN
INTRAMUSCULAR | Status: AC
  Filled 2019-08-10: qty 2

## 2019-08-10 MED ORDER — CHLORHEXIDINE GLUCONATE 0.12 % MT SOLN
OROMUCOSAL | Status: AC
  Administered 2019-08-10: 15 mL via OROMUCOSAL
  Filled 2019-08-10: qty 15

## 2019-08-10 MED ORDER — LIDOCAINE HCL (CARDIAC) PF 100 MG/5ML IV SOSY
PREFILLED_SYRINGE | INTRAVENOUS | Status: DC | PRN
  Administered 2019-08-10: 60 mg via INTRAVENOUS

## 2019-08-10 MED ORDER — CHLORHEXIDINE GLUCONATE 0.12 % MT SOLN: 15.0000 mL | Freq: Once | OROMUCOSAL | Status: AC

## 2019-08-10 MED ORDER — DEXTROSE IN LACTATED RINGERS 5 % IV SOLN: INTRAVENOUS | Status: DC

## 2019-08-10 MED ORDER — MIDAZOLAM HCL 2 MG/2ML IJ SOLN
INTRAMUSCULAR | Status: DC | PRN
  Administered 2019-08-10: 2 mg via INTRAVENOUS

## 2019-08-10 MED ORDER — PROPOFOL 10 MG/ML IV BOLUS
INTRAVENOUS | Status: DC | PRN
  Administered 2019-08-10: 130 mg via INTRAVENOUS

## 2019-08-10 SURGICAL SUPPLY — 26 items
ADAPTER VACURETTE TBG SET 14 (CANNULA) IMPLANT
CATH ROBINSON RED A/P 16FR (CATHETERS) ×2 IMPLANT
COVER WAND RF STERILE (DRAPES) IMPLANT
CUP MEDICINE 2OZ PLAST GRAD ST (MISCELLANEOUS) ×2 IMPLANT
DRSG TELFA 3X8 NADH (GAUZE/BANDAGES/DRESSINGS) ×2 IMPLANT
FILTER UTR ASPR SPEC (MISCELLANEOUS) ×1 IMPLANT
FLTR UTR ASPR SPEC (MISCELLANEOUS) ×2
GAUZE 4X4 16PLY RFD (DISPOSABLE) ×2 IMPLANT
GLOVE BIOGEL PI ORTHO PRO 7.5 (GLOVE) ×1
GLOVE PI ORTHO PRO STRL 7.5 (GLOVE) ×1 IMPLANT
GOWN STRL REUS W/ TWL LRG LVL3 (GOWN DISPOSABLE) ×2 IMPLANT
GOWN STRL REUS W/TWL LRG LVL3 (GOWN DISPOSABLE) ×2
KIT BERKELEY 1ST TRIMESTER 3/8 (MISCELLANEOUS) ×2 IMPLANT
KIT TURNOVER KIT A (KITS) ×2 IMPLANT
NEEDLE HYPO 25X1 1.5 SAFETY (NEEDLE) ×2 IMPLANT
PACK DNC HYST (MISCELLANEOUS) ×2 IMPLANT
PAD OB MATERNITY 4.3X12.25 (PERSONAL CARE ITEMS) ×2 IMPLANT
PAD PREP 24X41 OB/GYN DISP (PERSONAL CARE ITEMS) ×2 IMPLANT
SET BERKELEY SUCTION TUBING (SUCTIONS) ×2 IMPLANT
SOL PREP PVP 2OZ (MISCELLANEOUS) ×2
SOLUTION PREP PVP 2OZ (MISCELLANEOUS) ×1 IMPLANT
VACURETTE 10 RIGID CVD (CANNULA) ×2 IMPLANT
VACURETTE 12 RIGID CVD (CANNULA) IMPLANT
VACURETTE 7MM F TIP (CANNULA)
VACURETTE 7MM F TIP STRL (CANNULA) IMPLANT
VACURETTE 8 RIGID CVD (CANNULA) IMPLANT

## 2019-08-10 NOTE — Anesthesia Procedure Notes (Signed)
Procedure Name: Intubation Date/Time: 08/10/2019 3:16 PM Performed by: Jonna Clark, CRNA Pre-anesthesia Checklist: Patient identified, Patient being monitored, Timeout performed, Emergency Drugs available and Suction available Patient Re-evaluated:Patient Re-evaluated prior to induction Oxygen Delivery Method: Circle system utilized Preoxygenation: Pre-oxygenation with 100% oxygen Induction Type: IV induction Ventilation: Mask ventilation without difficulty Laryngoscope Size: Mac and 3 Grade View: Grade II Tube type: Oral Tube size: 6.5 mm Number of attempts: 1 Placement Confirmation: ETT inserted through vocal cords under direct vision,  positive ETCO2 and breath sounds checked- equal and bilateral Secured at: 20 cm Tube secured with: Tape Dental Injury: Teeth and Oropharynx as per pre-operative assessment

## 2019-08-10 NOTE — Anesthesia Preprocedure Evaluation (Addendum)
Anesthesia Evaluation  Patient identified by MRN, date of birth, ID band Patient awake    Reviewed: Allergy & Precautions, H&P , NPO status , Patient's Chart, lab work & pertinent test results  History of Anesthesia Complications Negative for: history of anesthetic complications  Airway Mallampati: III  TM Distance: >3 FB Neck ROM: full    Dental  (+) Chipped, Poor Dentition   Pulmonary neg pulmonary ROS, neg shortness of breath,           Cardiovascular Exercise Tolerance: Good (-) angina(-) Past MI and (-) DOE negative cardio ROS       Neuro/Psych PSYCHIATRIC DISORDERS Anxiety Depression Bipolar Disorder    GI/Hepatic Neg liver ROS, GERD  Medicated and Controlled,  Endo/Other  negative endocrine ROS  Renal/GU negative Renal ROS  negative genitourinary   Musculoskeletal negative musculoskeletal ROS (+)   Abdominal   Peds negative pediatric ROS (+)  Hematology negative hematology ROS (+)   Anesthesia Other Findings Patient reports no problems with oxycodone   Past Medical History: No date: Anxiety No date: Depression No date: GERD (gastroesophageal reflux disease)     Comment:  NO MEDS since childhood: Irritable bowel syndrome (IBS) No date: Mental disorder     Comment:  Bipolar/ Manic Depression ongoing: Pinched nerve     Comment:  in foot, with inflammation No date: Thoracic outlet syndrome  Past Surgical History: 02/09/2017: CESAREAN SECTION; N/A     Comment:  Procedure: CESAREAN SECTION;  Surgeon: Linzie Collin, MD;  Location: [facility] OR[facility];  Service: Obstetrics;                Laterality: N/A; No date: FIRST RIB REMOVAL No date: TONSILLECTOMY  BMI    Body Mass Index: 31.83 kg/m      Reproductive/Obstetrics negative OB ROS                             Anesthesia Physical  Anesthesia Plan  ASA: II  Anesthesia Plan: General   Post-op Pain  Management:    Induction:   PONV Risk Score and Plan:   Airway Management Planned: LMA  Additional Equipment:   Intra-op Plan:   Post-operative Plan: Extubation in OR  Informed Consent: I have reviewed the patients History and Physical, chart, labs and discussed the procedure including the risks, benefits and alternatives for the proposed anesthesia with the patient or authorized representative who has indicated his/her understanding and acceptance.     Dental Advisory Given  Plan Discussed with: Anesthesiologist, CRNA and Surgeon  Anesthesia Plan Comments:        Anesthesia Quick Evaluation

## 2019-08-10 NOTE — Discharge Instructions (Addendum)
Dilation and Curettage or Vacuum Curettage, Care After These instructions give you information about caring for yourself after your procedure. Your doctor may also give you more specific instructions. Call your doctor if you have any problems or questions after your procedure. Follow these instructions at home: Activity  Do not drive or use heavy machinery while taking prescription pain medicine.  For 24 hours after your procedure, avoid driving.  Take short walks often, followed by rest periods. Ask your doctor what activities are safe for you. After one or two days, you may be able to return to your normal activities.  Do not lift anything that is heavier than 10 lb (4.5 kg) until your doctor approves.  For at least 2 weeks, or as long as told by your doctor: ? Do not douche. ? Do not use tampons. ? Do not have sex. General instructions   Take over-the-counter and prescription medicines only as told by your doctor. This is very important if you take blood thinning medicine.  Do not take baths, swim, or use a hot tub until your doctor approves. Take showers instead of baths.  Wear compression stockings as told by your doctor.  It is up to you to get the results of your procedure. Ask your doctor when your results will be ready.  Keep all follow-up visits as told by your doctor. This is important. Contact a doctor if:  You have very bad cramps that get worse or do not get better with medicine.  You have very bad pain in your belly (abdomen).  You cannot drink fluids without throwing up (vomiting).  You get pain in a different part of the area between your belly and thighs (pelvis).  You have bad-smelling discharge from your vagina.  You have a rash. Get help right away if:  You are bleeding a lot from your vagina. A lot of bleeding means soaking more than one sanitary pad in an hour, for 2 hours in a row.  You have clumps of blood (blood clots) coming from your  vagina.  You have a fever or chills.  Your belly feels very tender or hard.  You have chest pain.  You have trouble breathing.  You cough up blood.  You feel dizzy.  You feel light-headed.  You pass out (faint).  You have pain in your neck or shoulder area. Summary  Take short walks often, followed by rest periods. Ask your doctor what activities are safe for you. After one or two days, you may be able to return to your normal activities.  Do not lift anything that is heavier than 10 lb (4.5 kg) until your doctor approves.  Do not take baths, swim, or use a hot tub until your doctor approves. Take showers instead of baths.  Contact your doctor if you have any symptoms of infection, like bad-smelling discharge from your vagina. This information is not intended to replace advice given to you by your health care provider. Make sure you discuss any questions you have with your health care provider. Document Revised: 12/12/2016 Document Reviewed: 09/17/2015 Elsevier Patient Education  2020 [employer]   AMBULATORY SURGERY  DISCHARGE INSTRUCTIONS   1) The drugs that you were given will stay in your system until tomorrow so for the next 24 hours you should not:  A) Drive an automobile B) Make any legal decisions C) Drink any alcoholic beverage   2) You may resume regular meals tomorrow.  Today it is better to start with   liquids and gradually work up to solid foods.  You may eat anything you prefer, but it is better to start with liquids, then soup and crackers, and gradually work up to solid foods.   3) Please notify your doctor immediately if you have any unusual bleeding, trouble breathing, redness and pain at the surgery site, drainage, fever, or pain not relieved by medication.    4) Additional Instructions:        Please contact your physician with any problems or Same Day Surgery at [phone], Monday through Friday 6 am to 4 pm, or [place] at  [place] Main number at [phone]. 

## 2019-08-10 NOTE — Transfer of Care (Signed)
Immediate Anesthesia Transfer of Care Note  Patient: Jaclyn Kelly  Procedure(s) Performed: DILATATION AND EVACUATION (N/A )  Patient Location: PACU  Anesthesia Type:General  Level of Consciousness: alert , oriented and drowsy  Airway & Oxygen Therapy: Patient Spontanous Breathing  Post-op Assessment: Report given to RN and Post -op Vital signs reviewed and stable  Post vital signs: Reviewed and stable  Last Vitals:  Vitals Value Taken Time  BP 101/57 08/10/19 1553  Temp    Pulse    Resp 16 08/10/19 1553  SpO2 100 % 08/10/19 1553    Last Pain:  Vitals:   08/10/19 1246  TempSrc: Temporal  PainSc: 0-No pain         Complications: No complications documented.

## 2019-08-10 NOTE — Op Note (Signed)
    OPERATIVE NOTE 08/10/2019 4:02 PM  PRE-OPERATIVE DIAGNOSIS:  1) imcomplete AB 2) Covid Positive  POST-OPERATIVE DIAGNOSIS:  Same  OPERATION:  D&E  SURGEON(S): Surgeon(s) and Role:    Linzie Collin, MD - Primary   ANESTHESIA: General  ESTIMATED BLOOD LOSS: 41mL  OPERATIVE FINDINGS: uterine contents  SPECIMEN:  ID Type Source Tests Collected by Time Destination  1 : uterine content Tissue PATH Other SURGICAL PATHOLOGY Linzie Collin, MD 08/10/2019 1541     COMPLICATIONS: None  DRAINS: Foley to gravity  DISPOSITION: Stable to recovery room  DESCRIPTION OF PROCEDURE:      The patient was prepped and draped in the dorsal lithotomy position and placed under general anesthesia. Her cervix was grasped with a Jacob's tenaculum. Respecting the position and curvature of her cervix, it was dilated to accommodate a number 10 suction curette. The suction curette was placed within the endometrial cavity and a pressure greater than 65 mmHg was allowed to build. A systematic curettage was performed in all quadrants until no additional tissue was noted. The uterus became firm and globular. Pitocin was run in the IV. The tenaculum was removed from the cervix and hemostasis was noted. The weighted speculum was removed and the patient went to recovery room in stable condition.  Linzie Collin, MD [address] [place] [address] [phone]  Follow up CALL OFFICE TO SCHEDULE/CONFIRM FOLLOW UP APPT.   Elonda Husky, M.D. 08/10/2019 4:02 PM

## 2019-08-10 NOTE — Interval H&P Note (Signed)
History and Physical Interval Note:  08/10/2019 2:42 PM  Jaclyn Kelly  has presented today for surgery, with the diagnosis of imcomplete AB.  The various methods of treatment have been discussed with the patient and family. After consideration of risks, benefits and other options for treatment, the patient has consented to  Procedure(s): DILATATION AND EVACUATION (N/A) as a surgical intervention.  The patient's history has been reviewed, patient examined, no change in status, stable for surgery.  I have reviewed the patient's chart and labs.  Questions were answered to the patient's satisfaction.     Brennan Bailey

## 2019-08-10 NOTE — Progress Notes (Signed)
Patient remains in OR 2 FOR RECOVERY DUE TO COVID POSITIVE RESULTS RECOVERING BY CRNA.

## 2019-08-11 ENCOUNTER — Encounter: Payer: Self-pay | Admitting: Obstetrics and Gynecology

## 2019-08-12 LAB — SURGICAL PATHOLOGY

## 2019-08-12 NOTE — Anesthesia Postprocedure Evaluation (Signed)
Anesthesia Post Note  Patient: Jaclyn Kelly  Procedure(s) Performed: DILATATION AND EVACUATION (N/A )  Patient location during evaluation: PACU Anesthesia Type: General Level of consciousness: awake and alert Pain management: pain level controlled Vital Signs Assessment: post-procedure vital signs reviewed and stable Respiratory status: spontaneous breathing, nonlabored ventilation and respiratory function stable Cardiovascular status: blood pressure returned to baseline and stable Postop Assessment: no apparent nausea or vomiting Anesthetic complications: no   No complications documented.   Last Vitals:  Vitals:   08/10/19 1246 08/10/19 1553  BP: 116/77 (!) 101/57  Pulse: 91   Resp: 16 16  Temp: 36.9 C   SpO2: 99% 100%    Last Pain:  Vitals:   08/10/19 1246  TempSrc: Temporal  PainSc: 0-No pain                 Aurelio Brash Yonatan Guitron

## 2019-09-06 ENCOUNTER — Telehealth: Payer: Self-pay | Admitting: Obstetrics and Gynecology

## 2019-09-06 ENCOUNTER — Other Ambulatory Visit: Payer: Self-pay | Admitting: Surgical

## 2019-09-06 DIAGNOSIS — R102 Pelvic and perineal pain: Secondary | ICD-10-CM

## 2019-09-06 NOTE — Telephone Encounter (Signed)
Patient called in requesting to speak with her provider regarding some vaginal pain/bleeding she was having. Patient states this has been an ongoing issue and Dr. Logan Bores is aware. Patient requested to come in today but she stated "I doubt that will happen". Informed her I would send a message to her provider. Patient states if she cant be seen then she will have to find another doctor.

## 2019-09-06 NOTE — Telephone Encounter (Signed)
Tried to call patient to schedule her for tomorrow. Voicemail mail. Sent my chart message for patient to call and schedule.

## 2019-09-07 ENCOUNTER — Ambulatory Visit (INDEPENDENT_AMBULATORY_CARE_PROVIDER_SITE_OTHER): Payer: Medicaid Other

## 2019-09-07 ENCOUNTER — Encounter: Payer: Self-pay | Admitting: Obstetrics and Gynecology

## 2019-09-07 ENCOUNTER — Other Ambulatory Visit: Payer: Self-pay

## 2019-09-07 ENCOUNTER — Ambulatory Visit (INDEPENDENT_AMBULATORY_CARE_PROVIDER_SITE_OTHER): Payer: Medicaid Other | Admitting: Obstetrics and Gynecology

## 2019-09-07 VITALS — BP 99/67 | HR 86 | Ht 61.0 in | Wt 122.3 lb

## 2019-09-07 DIAGNOSIS — R102 Pelvic and perineal pain: Secondary | ICD-10-CM

## 2019-09-07 DIAGNOSIS — Z30011 Encounter for initial prescription of contraceptive pills: Secondary | ICD-10-CM

## 2019-09-07 DIAGNOSIS — N938 Other specified abnormal uterine and vaginal bleeding: Secondary | ICD-10-CM

## 2019-09-07 MED ORDER — LEVONORGEST-ETH ESTRAD 91-DAY 0.15-0.03 &0.01 MG PO TABS: 1.0000 | ORAL_TABLET | Freq: Every day | ORAL | 1 refills | Status: DC

## 2019-09-07 NOTE — Progress Notes (Signed)
HPI:      Jaclyn Kelly is a 19 y.o. [id number] who LMP was Patient's last menstrual period was 05/16/2019.  Subjective:   She presents today complaining of daily spotting and pelvic crampy pain.  She had an ultrasound earlier today to investigate the endometrium. Patient desires OCPs for birth control. Of significant note patient had an abortion followed by retained products, followed by D and E at [facility].  During this time she was also diagnosed with Covid and she had a fairly aggressive course for Covid.    Hx: The following portions of the patient's history were reviewed and updated as appropriate:             She  has a past medical history of Anxiety, Depression, GERD (gastroesophageal reflux disease), Irritable bowel syndrome (IBS) (since childhood), Mental disorder, Pinched nerve (ongoing), and Thoracic outlet syndrome. She does not have any pertinent problems on file. She  has a past surgical history that includes First rib removal; Tonsillectomy; Cesarean section (N/A, 02/09/2017); Cesarean section (N/A, 11/22/2018); and Dilation and evacuation (N/A, 08/10/2019). Her family history includes Healthy in her mother; Heart failure in her father; Hypertension in her father; Stroke in her father. She  reports that she has never smoked. She has never used smokeless tobacco. She reports that she does not drink alcohol and does not use drugs. She has a current medication list which includes the following prescription(s): cephalexin, ibuprofen, nitrofurantoin (macrocrystal-monohydrate), suboxone, and levonorgestrel-ethinyl estradiol. She is allergic to vicodin [hydrocodone-acetaminophen], amoxicillin, and tramadol.       Review of Systems:  Review of Systems  Constitutional: Denied constitutional symptoms, night sweats, recent illness, fatigue, fever, insomnia and weight loss.  Eyes: Denied eye symptoms, eye pain, photophobia, vision change and visual disturbance.  Ears/Nose/Throat/Neck:  Denied ear, nose, throat or neck symptoms, hearing loss, nasal discharge, sinus congestion and sore throat.  Cardiovascular: Denied cardiovascular symptoms, arrhythmia, chest pain/pressure, edema, exercise intolerance, orthopnea and palpitations.  Respiratory: Denied pulmonary symptoms, asthma, pleuritic pain, productive sputum, cough, dyspnea and wheezing.  Gastrointestinal: Denied, gastro-esophageal reflux, melena, nausea and vomiting.  Genitourinary: See HPI for additional information.  Musculoskeletal: Denied musculoskeletal symptoms, stiffness, swelling, muscle weakness and myalgia.  Dermatologic: Denied dermatology symptoms, rash and scar.  Neurologic: Denied neurology symptoms, dizziness, headache, neck pain and syncope.  Psychiatric: Denied psychiatric symptoms, anxiety and depression.  Endocrine: Denied endocrine symptoms including hot flashes and night sweats.   Meds:   Current Outpatient Medications on File Prior to Visit  Medication Sig Dispense Refill  . cephALEXin (KEFLEX) 500 MG capsule Take 1 capsule (500 mg total) by mouth 4 (four) times daily. 28 capsule 2  . ibuprofen (ADVIL) 800 MG tablet Take 1 tablet (800 mg total) by mouth every 8 (eight) hours as needed. 30 tablet 0  . nitrofurantoin, macrocrystal-monohydrate, (MACROBID) 100 MG capsule Take 1 capsule (100 mg total) by mouth 2 (two) times daily. 14 capsule 1  . SUBOXONE 8-2 MG FILM Place 1 Film under the tongue daily.     No current facility-administered medications on file prior to visit.    Objective:     Vitals:   09/07/19 1508  BP: 99/67  Pulse: 86              Ultrasound results reviewed and discussed with patient  Assessment:    [id number] Patient Active Problem List   Diagnosis Date Noted  . Cesarean delivery delivered 11/22/2018  . Indication for care in labor or delivery 10/24/2018  .  Indication for care/intervention related to labor/delivery, antepartum 08/29/2018  . Prior fetal macrosomia,  antepartum 06/16/2018  . Rubella non-immune status, antepartum 06/16/2018  . History of cesarean delivery 06/16/2018  . Post-dates pregnancy 02/09/2017  . Pregnancy 12/01/2016  . Bipolar affective disorder in remission (HCC) 08/19/2016  . Severe episode of recurrent major depressive disorder, without psychotic features (HCC)   . Insomnia 06/27/2015  . MDD (major depressive disorder) 06/25/2015     1. Pelvic pain in female   2. Dysfunctional uterine bleeding   3. Initiation of OCP (BCP)     Doubt retained products.  Thickened endometrium present.  No other pelvic pathology on ultrasound.   Plan:            1.  Beta-hCG  2.  As patient desires birth control will begin OCPs to thin the endometrium which should stop her pelvic pain and daily spotting.  3.  If hCG elevated continue serial measurements until 0.  OCPs The risks /benefits of OCPs have been explained to the patient in detail.  Product literature has been given to her where appropriate.  I have instructed her in the use of OCPs.  I have explained to the patient that OCPs are not as effective for birth control during the first month of use, and that another form of contraception should be used during this time.  Both first-day start and Sunday start have been explained.  The risks and benefits of each was discussed.  She has been made aware of  the fact that in rare circumstances, other medications may affect the efficacy of OCPs.  I have answered all of her questions, and I believe that she has an understanding of the effectiveness and use of OCPs.   Orders Orders Placed This Encounter  Procedures  . Beta hCG quant (ref lab)     Meds ordered this encounter  Medications  . Levonorgestrel-Ethinyl Estradiol (AMETHIA) 0.15-0.03 &0.01 MG tablet    Sig: Take 1 tablet by mouth at bedtime.    Dispense:  84 tablet    Refill:  1      F/U  Return in about 3 months (around 12/08/2019). I spent 22 minutes involved in the care of  this patient preparing to see the patient by obtaining and reviewing her medical history (including labs, imaging tests and prior procedures), documenting clinical information in the electronic health record (EHR), counseling and coordinating care plans, writing and sending prescriptions, ordering tests or procedures and directly communicating with the patient by discussing pertinent items from her history and physical exam as well as detailing my assessment and plan as noted above so that she has an informed understanding.  All of her questions were answered.  Elonda Husky, M.D. 09/07/2019 3:27 PM

## 2019-09-08 ENCOUNTER — Telehealth: Payer: Self-pay | Admitting: Obstetrics and Gynecology

## 2019-09-08 LAB — BETA HCG QUANT (REF LAB): hCG Quant: 8 m[IU]/mL

## 2019-09-08 NOTE — Telephone Encounter (Signed)
Pt called in and stated that she got her test results back and that she wants a call back. I told her yes the pt gets them 1st the doctor will go over them and they will either message you on mychart or they can call you. I told the pt to please allow 24-48 hours for a call from the nurse. The pt verbally understood. Please advise

## 2019-09-09 NOTE — Telephone Encounter (Signed)
I have already discussed with patient.

## 2019-09-09 NOTE — Telephone Encounter (Signed)
Please advise 

## 2019-09-09 NOTE — Telephone Encounter (Signed)
Spoke with patient and she is still having the bleeding. She is also still having the pain. Patient has not picked up the [facility] from the pharmacy yet. She is going to do that today. I let her know that Dr. Logan Bores has not yet seen the labs and I would call when he does. Patient verbalized understanding.

## 2019-09-28 ENCOUNTER — Telehealth: Payer: Self-pay | Admitting: Obstetrics and Gynecology

## 2019-09-28 NOTE — Telephone Encounter (Signed)
Patient called in stating that she was wanting to be seen today or tomorrow for some vaginal itching. I noticed where she sent in a MyChart message regarding a bump on the inside of her vagina earlier today. Informed patient that her provider is out of the office today and tomorrow and returns Friday however he doesn't have any openings. Informed patient that I could get her scheduled for next week when he has some availability. Informed patient that I would send a message back to her provider but to allow 24-48 hours from the day he returns for him to get back in touch with her. Could you please advise?

## 2019-09-28 NOTE — Telephone Encounter (Signed)
I sent patient a my chart message informing her that Dr. Valentino Saxon can see her tomorrow at 2:45. Please contact her to see if this time will work. Thanks.

## 2019-09-29 ENCOUNTER — Ambulatory Visit (INDEPENDENT_AMBULATORY_CARE_PROVIDER_SITE_OTHER): Payer: Medicaid Other | Admitting: Obstetrics and Gynecology

## 2019-09-29 ENCOUNTER — Other Ambulatory Visit: Payer: Self-pay

## 2019-09-29 ENCOUNTER — Encounter: Payer: Self-pay | Admitting: Obstetrics and Gynecology

## 2019-09-29 VITALS — BP 111/75 | HR 78 | Ht 61.0 in | Wt 123.2 lb

## 2019-09-29 DIAGNOSIS — N898 Other specified noninflammatory disorders of vagina: Secondary | ICD-10-CM

## 2019-09-29 NOTE — Progress Notes (Signed)
Pt present due to having pain in the vaginal area and other vaginal concerns. Pt c/o of left side pain and lump/bump on right side.

## 2019-09-29 NOTE — Progress Notes (Signed)
    GYNECOLOGY PROGRESS NOTE  Subjective:    Patient ID: Jaclyn Kelly, female    DOB: 12/06/2000, 19 y.o.   MRN: [id number]  HPI  Patient is a 19 y.o. [id number] female who presents for complaints of a vaginal bump that has been present for slightly over 1 week on the right side.  Notes that it is mildly tender to touch (mostly feels pressure). Denies vaginal discharge, itching, burning.  She has been experiencing abnormal bleeding since her pregnancy termination several months ago, recently started on OCPs to help regulate her cycle.   The following portions of the patient's history were reviewed and updated as appropriate: allergies, current medications, past family history, past medical history, past social history, past surgical history and problem list.  Review of Systems Pertinent items noted in HPI and remainder of comprehensive ROS otherwise negative.   Objective:   Blood pressure 111/75, pulse 78, height 5\' 1"  (1.549 m), weight 123 lb 3.2 oz (55.9 kg), last menstrual period 05/16/2019, unknown if currently breastfeeding. General appearance: alert and no distress Pelvic: external genitalia normal, rectovaginal septum normal.  Vagina without discharge. Small ~ 0.5 x 05 cm nodule palpable along right lateral posterior vaginal wall beneath the mucosa, ~ 3 cm beyond introitus. It is mildly tender to deep palpation, and mobile. Lesion is not physically visible. Cervix normal appearing, no lesions and no motion tenderness.  Uterus mobile, nontender, normal shape and size.  Adnexae non-palpable, nontender bilaterally.    Assessment:   Vaginal mass   Plan:   - Small palpable nodular region noted in posterior vaginal wall, mildly tender to deep palpation. Could potentially be a lymph node or inclusion cyst.  Reassured patient that lesion was not likely worrisome and would resolve with time.  Unable to attempt to biopsy or drain due to location. Advised on sitz baths with epsom salts,  massage oft he area as tolerated. TO f/u if symptoms worsen or fail to resolve.    07/16/2019, MD [facility]   Vaginal "inclusion cyst" vs lymph node,

## 2019-10-07 ENCOUNTER — Encounter: Payer: Medicaid Other | Admitting: Obstetrics and Gynecology

## 2019-10-10 ENCOUNTER — Other Ambulatory Visit: Payer: Self-pay | Admitting: Surgical

## 2019-10-10 DIAGNOSIS — O0380 Unspecified complication following complete or unspecified spontaneous abortion: Secondary | ICD-10-CM

## 2019-10-11 ENCOUNTER — Telehealth: Payer: Self-pay

## 2019-10-11 ENCOUNTER — Telehealth: Payer: Self-pay | Admitting: Obstetrics and Gynecology

## 2019-10-11 NOTE — Telephone Encounter (Signed)
Called Robbie Lis complete this afternoon to have PA escalated. Spoke with Bria.T [phone].

## 2019-10-11 NOTE — Telephone Encounter (Signed)
Patient called in because she had received an call from [place] however missed it. Informed patient that she was being called because she needed to have her insurance switched over as she was enrolled in [employer]. Informed her that I could help her get it switched to one of the managed care plans that we do accept. Patient then became irate and used foul language stating "This is absolutely ridiculous, I don't have time for this bull####"". Informed patient that I could provider her with the phone number to get it switched over and that it was important she get it switched over so that she could get her ultrasound completed, patient then stated "I don't have time to be on the d### phone all day". Apologized to patient and informed her I was just trying to help her, patient was not listening to me as I was explaining things to her. I tried to explain that Medicaid switched over to managed care plans and that they enrolled her in [employer], which [facility] does not accept. I listed the manage care plans that we do accept. Informed patient that if she didn't get it switched over she would be considered self-pay.

## 2019-10-12 NOTE — Telephone Encounter (Signed)
Patient sent my chart message apologizing this morning for the way she spoke with front staff yesterday. I have relayed the message to the front staff. I have been in contact with the patient through my chart.

## 2019-10-14 ENCOUNTER — Ambulatory Visit
Admission: RE | Admit: 2019-10-14 | Discharge: 2019-10-14 | Disposition: A | Discharge status: Home / Self Care | Payer: Medicaid Other | Source: Ambulatory Visit | Attending: Obstetrics and Gynecology | Admitting: Obstetrics and Gynecology

## 2019-10-14 ENCOUNTER — Other Ambulatory Visit: Payer: Self-pay

## 2019-10-14 DIAGNOSIS — R102 Pelvic and perineal pain: Secondary | ICD-10-CM | POA: Insufficient documentation

## 2019-10-17 ENCOUNTER — Telehealth: Payer: Self-pay | Admitting: Obstetrics and Gynecology

## 2019-10-17 DIAGNOSIS — O0380 Unspecified complication following complete or unspecified spontaneous abortion: Secondary | ICD-10-CM

## 2019-10-17 DIAGNOSIS — N939 Abnormal uterine and vaginal bleeding, unspecified: Secondary | ICD-10-CM

## 2019-10-17 NOTE — Telephone Encounter (Signed)
Dr. Archer Asa phone conversation:  I called him regarding possibility of AV malformation or vascular abnormality on ultrasound.  He reported that on color flow there appeared to be some type of vascular issues in the myometrium-specifically not the endometrium.  He favors a vascular abnormality which could include AV malformation, adenomyosis, or myeloma over retained products. His opinion was the next study that had to be performed was a pelvic MRI with and without contrast.

## 2019-10-18 ENCOUNTER — Ambulatory Visit (INDEPENDENT_AMBULATORY_CARE_PROVIDER_SITE_OTHER): Payer: Medicaid Other | Admitting: Obstetrics and Gynecology

## 2019-10-18 ENCOUNTER — Encounter: Payer: Self-pay | Admitting: Obstetrics and Gynecology

## 2019-10-18 ENCOUNTER — Other Ambulatory Visit: Payer: Self-pay

## 2019-10-18 VITALS — BP 110/73 | HR 81 | Ht 61.0 in | Wt 122.3 lb

## 2019-10-18 DIAGNOSIS — N939 Abnormal uterine and vaginal bleeding, unspecified: Secondary | ICD-10-CM

## 2019-10-18 DIAGNOSIS — O0380 Unspecified complication following complete or unspecified spontaneous abortion: Secondary | ICD-10-CM

## 2019-10-18 NOTE — Progress Notes (Addendum)
HPI:      [name] is a 19 y.o. [id number] who LMP was Patient's last menstrual period was 05/16/2019.     Subjective:   She presents today stating that she has continued to bleed daily.  She is taking OCPs as directed.  She reports that some days the bleeding is very light and other days not quite as light.  She usually uses 1 tampon or 1 pad per day.  She also complains of occasional right-sided pain. She recently had an ultrasound and one of the reasons she is here today is to discuss the findings at ultrasound. She has been scheduled for a follow-up MRI.    Hx: The following portions of the patient's history were reviewed and updated as appropriate:             She  has a past medical history of Anxiety, Depression, GERD (gastroesophageal reflux disease), Irritable bowel syndrome (IBS) (since childhood), Mental disorder, Pinched nerve (ongoing), and Thoracic outlet syndrome. She does not have any pertinent problems on file. She  has a past surgical history that includes First rib removal; Tonsillectomy; Cesarean section (N/A, 02/09/2017); Cesarean section (N/A, 11/22/2018); and Dilation and evacuation (N/A, 08/10/2019). Her family history includes Healthy in her mother; Heart failure in her father; Hypertension in her father; Stroke in her father. She  reports that she has never smoked. She has never used smokeless tobacco. She reports that she does not drink alcohol and does not use drugs. She has a current medication list which includes the following prescription(s): ibuprofen, levonorgestrel-ethinyl estradiol, and suboxone. She is allergic to vicodin [hydrocodone-acetaminophen], amoxicillin, and tramadol.       Review of Systems:  Review of Systems  Constitutional: Denied constitutional symptoms, night sweats, recent illness, fatigue, fever, insomnia and weight loss.  Eyes: Denied eye symptoms, eye pain, photophobia, vision change and visual disturbance.   Ears/Nose/Throat/Neck: Denied ear, nose, throat or neck symptoms, hearing loss, nasal discharge, sinus congestion and sore throat.  Cardiovascular: Denied cardiovascular symptoms, arrhythmia, chest pain/pressure, edema, exercise intolerance, orthopnea and palpitations.  Respiratory: Denied pulmonary symptoms, asthma, pleuritic pain, productive sputum, cough, dyspnea and wheezing.  Gastrointestinal: Denied, gastro-esophageal reflux, melena, nausea and vomiting.  Genitourinary: See HPI for additional information.  Musculoskeletal: Denied musculoskeletal symptoms, stiffness, swelling, muscle weakness and myalgia.  Dermatologic: Denied dermatology symptoms, rash and scar.  Neurologic: Denied neurology symptoms, dizziness, headache, neck pain and syncope.  Psychiatric: Denied psychiatric symptoms, anxiety and depression.  Endocrine: Denied endocrine symptoms including hot flashes and night sweats.   Meds:   Current Outpatient Medications on File Prior to Visit  Medication Sig Dispense Refill  . ibuprofen (ADVIL) 800 MG tablet Take 1 tablet (800 mg total) by mouth every 8 (eight) hours as needed. 30 tablet 0  . Levonorgestrel-Ethinyl Estradiol (AMETHIA) 0.15-0.03 &0.01 MG tablet Take 1 tablet by mouth at bedtime. 84 tablet 1  . SUBOXONE 8-2 MG FILM Place 1 Film under the tongue daily.     No current facility-administered medications on file prior to visit.    Objective:     Vitals:   10/18/19 0949  BP: 110/73  Pulse: 81   Filed Weights   10/18/19 0949  Weight: 122 lb 4.8 oz (55.5 kg)              Ultrasound results reviewed directly with the patient   Discussion regarding differential diagnosis as discussed with Dr. Archer Asa also discussed with the patient.  Assessment:    [id number] Patient  Active Problem List   Diagnosis Date Noted  . Cesarean delivery delivered 11/22/2018  . History of cesarean delivery 06/16/2018  . Bipolar affective disorder in remission (HCC) 08/19/2016   . Severe episode of recurrent major depressive disorder, without psychotic features (HCC)   . Insomnia 06/27/2015  . MDD (major depressive disorder) 06/25/2015     1. Post-abortion complication   2. Vaginal bleeding     Dr. Archer Asa believes that this ultrasound localized abnormality is located in the myometrium not endometrium.  He says it is not likely consistent with retained products of conception.  His differential diagnosis is AVM, small fibroid, adenomyosis.  Plan:            1.  I discussed the differential diagnosis above in detail with the patient.  Necessity of MRI reviewed and discussed.  All questions answered. Orders No orders of the defined types were placed in this encounter.   No orders of the defined types were placed in this encounter.     F/U  Return for We will contact her with any abnormal test results. I spent 23 minutes involved in the care of this patient preparing to see the patient by obtaining and reviewing her medical history (including labs, imaging tests and prior procedures), documenting clinical information in the electronic health record (EHR), counseling and coordinating care plans, writing and sending prescriptions, ordering tests or procedures and directly communicating with the patient by discussing pertinent items from her history and physical exam as well as detailing my assessment and plan as noted above so that she has an informed understanding.  All of her questions were answered.  Elonda Husky, M.D. 10/18/2019 10:34 AM

## 2019-10-20 ENCOUNTER — Ambulatory Visit: Payer: Medicaid Other

## 2019-10-20 ENCOUNTER — Ambulatory Visit
Admission: RE | Admit: 2019-10-20 | Discharge: 2019-10-20 | Disposition: A | Discharge status: Home / Self Care | Payer: Medicaid Other | Source: Ambulatory Visit | Attending: Obstetrics and Gynecology | Admitting: Obstetrics and Gynecology

## 2019-10-20 ENCOUNTER — Ambulatory Visit: Admission: RE | Admit: 2019-10-20 | Payer: Medicaid Other | Source: Ambulatory Visit

## 2019-10-20 ENCOUNTER — Other Ambulatory Visit: Payer: Self-pay

## 2019-10-20 DIAGNOSIS — O0380 Unspecified complication following complete or unspecified spontaneous abortion: Secondary | ICD-10-CM | POA: Diagnosis not present

## 2019-10-20 DIAGNOSIS — N939 Abnormal uterine and vaginal bleeding, unspecified: Secondary | ICD-10-CM | POA: Diagnosis present

## 2019-10-20 MED ORDER — GADOBUTROL 1 MMOL/ML IV SOLN
6.0000 mL | Freq: Once | INTRAVENOUS | Status: AC | PRN
  Administered 2019-10-20: 6 mL via INTRAVENOUS

## 2019-10-26 ENCOUNTER — Other Ambulatory Visit: Payer: Self-pay

## 2019-10-26 ENCOUNTER — Encounter: Payer: Self-pay | Admitting: Obstetrics and Gynecology

## 2019-10-26 ENCOUNTER — Ambulatory Visit (INDEPENDENT_AMBULATORY_CARE_PROVIDER_SITE_OTHER): Payer: Medicaid Other | Admitting: Obstetrics and Gynecology

## 2019-10-26 VITALS — Ht 61.0 in | Wt 122.0 lb

## 2019-10-26 DIAGNOSIS — N939 Abnormal uterine and vaginal bleeding, unspecified: Secondary | ICD-10-CM | POA: Diagnosis not present

## 2019-10-26 DIAGNOSIS — O0380 Unspecified complication following complete or unspecified spontaneous abortion: Secondary | ICD-10-CM

## 2019-10-26 DIAGNOSIS — Q2730 Arteriovenous malformation, site unspecified: Secondary | ICD-10-CM

## 2019-10-26 NOTE — Progress Notes (Signed)
Virtual Visit via Telephone Note  I connected with Jaclyn Kelly on 10/26/19 at 11:30 AM EDT by telephone and verified that I am speaking with the correct person using two identifiers.    [name] is a 19 y.o. [id number] who LMP was Patient's last menstrual period was 05/16/2019. I discussed the limitations, risks, security and privacy concerns of performing an evaluation and management service by telephone and the availability of in person appointments. I also discussed with the patient that there may be a patient responsible charge related to this service. The patient expressed understanding and agreed to proceed.  Location of patient: Home  Patient gave explicit verbal consent for telephone visit:  YES  Location of provider:  [facility] office  Persons other than physician and patient involved in provider conference:  None   Subjective:   History of Present Illness:    Patient has continued to have bleeding after elective termination and subsequent D&E for retained products and use of OCPs. 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  Hx: The following portions of the patient's history were reviewed and updated as appropriate:             She  has a past medical history of Anxiety, Depression, GERD (gastroesophageal reflux disease), Irritable bowel syndrome (IBS) (since childhood), Mental disorder, Pinched nerve (ongoing), and Thoracic outlet syndrome. She does not have any pertinent problems on file. She  has a past surgical history that includes First rib removal; Tonsillectomy; Cesarean section (N/A, 02/09/2017); Cesarean section (N/A, 11/22/2018); and Dilation and evacuation (N/A, 08/10/2019). Her family history includes Healthy in her mother; Heart failure in her father; Hypertension in her father; Stroke in her father. She  reports that she has never smoked. She has never used smokeless tobacco. She reports that she does not  drink alcohol and does not use drugs. She has a current medication list which includes the following prescription(s): ibuprofen, levonorgestrel-ethinyl estradiol, and suboxone. She is allergic to vicodin [hydrocodone-acetaminophen], amoxicillin, and tramadol.       Review of Systems:  Review of Systems  Constitutional: Denied constitutional symptoms, night sweats, recent illness, fatigue, fever, insomnia and weight loss.  Eyes: Denied eye symptoms, eye pain, photophobia, vision change and visual disturbance.  Ears/Nose/Throat/Neck: Denied ear, nose, throat or neck symptoms, hearing loss, nasal discharge, sinus congestion and sore throat.  Cardiovascular: Denied cardiovascular symptoms, arrhythmia, chest pain/pressure, edema, exercise intolerance, orthopnea and palpitations.  Respiratory: Denied pulmonary symptoms, asthma, pleuritic pain, productive sputum, cough, dyspnea and wheezing.  Gastrointestinal: Denied, gastro-esophageal reflux, melena, nausea and vomiting.  Genitourinary: See HPI for additional information.  Musculoskeletal: Denied musculoskeletal symptoms, stiffness, swelling, muscle weakness and myalgia.  Dermatologic: Denied dermatology symptoms, rash and scar.  Neurologic: Denied neurology symptoms, dizziness, headache, neck pain and syncope.  Psychiatric: Denied psychiatric symptoms, anxiety and depression.  Endocrine: Denied endocrine symptoms including hot flashes and night sweats.   Meds:   Current Outpatient Medications on File Prior to Visit  Medication Sig Dispense Refill  . ibuprofen (ADVIL) 800 MG tablet Take 1 tablet (800 mg total) by mouth every 8 (eight) hours as needed. 30 tablet 0  . Levonorgestrel-Ethinyl Estradiol (AMETHIA) 0.15-0.03 &0.01 MG tablet Take 1 tablet by mouth at bedtime. 84 tablet 1  . SUBOXONE 8-2 MG FILM Place 1 Film under the tongue daily.     No current facility-administered medications on file prior to visit.    Assessment:     [id number] Patient  Active Problem List   Diagnosis Date Noted  . Cesarean delivery delivered 11/22/2018  . History of cesarean delivery 06/16/2018  . Bipolar affective disorder in remission (HCC) 08/19/2016  . Severe episode of recurrent major depressive disorder, without psychotic features (HCC)   . Insomnia 06/27/2015  . MDD (major depressive disorder) 06/25/2015     1. Arteriovenous malformation (AVM)   2. Post-abortion complication   3. Vaginal bleeding       Plan:            1.  I spoke with her about the diagnosis of AV malformation.  I explained to her what was found that MRI.  I informed her of my conversation with Dr. Johnnette Litter at [facility] and with Dr. Wyn Quaker here at [facility].  I answered all her questions regarding this issue.  We briefly discussed what embolization was and what it entailed.  She was amenable to appointment with vascular Thursday or Friday to further discuss this option.  2.  Refer to vascular for possible embolization. Orders No orders of the defined types were placed in this encounter.   No orders of the defined types were placed in this encounter.     F/U  No follow-ups on file.   I spent 22 minutes involved in the care of this patient preparing to see the patient by obtaining and reviewing her medical history (including labs, imaging tests and prior procedures), documenting clinical information in the electronic health record (EHR), counseling and coordinating care plans, writing and sending prescriptions, ordering tests or procedures and directly communicating with the patient by discussing pertinent items from her history and physical exam as well as detailing my assessment and plan as noted above so that she has an informed understanding.  All of her questions were answered.

## 2019-10-28 ENCOUNTER — Other Ambulatory Visit: Payer: Self-pay

## 2019-10-28 ENCOUNTER — Encounter (INDEPENDENT_AMBULATORY_CARE_PROVIDER_SITE_OTHER): Payer: Self-pay | Admitting: Vascular Surgery

## 2019-10-28 ENCOUNTER — Ambulatory Visit (INDEPENDENT_AMBULATORY_CARE_PROVIDER_SITE_OTHER): Payer: Medicaid Other | Admitting: Vascular Surgery

## 2019-10-28 VITALS — BP 155/78 | HR 106 | Resp 16 | Wt 122.0 lb

## 2019-10-28 DIAGNOSIS — N938 Other specified abnormal uterine and vaginal bleeding: Secondary | ICD-10-CM | POA: Diagnosis not present

## 2019-10-28 DIAGNOSIS — Q760 Spina bifida occulta: Secondary | ICD-10-CM

## 2019-10-28 DIAGNOSIS — F3170 Bipolar disorder, currently in remission, most recent episode unspecified: Secondary | ICD-10-CM | POA: Diagnosis not present

## 2019-10-28 DIAGNOSIS — G540 Brachial plexus disorders: Secondary | ICD-10-CM | POA: Diagnosis not present

## 2019-10-28 DIAGNOSIS — N858 Other specified noninflammatory disorders of uterus: Secondary | ICD-10-CM | POA: Diagnosis not present

## 2019-10-28 NOTE — H&P (View-Only) (Signed)
Patient ID: Jaclyn Kelly, female   DOB: 02/10/2000, 19 y.o.   MRN: [id number]  Chief Complaint  Patient presents with  . New Patient (Initial Visit)    add on per Lary Eckardt    HPI Jaclyn Kelly is a 19 y.o. female.  I am asked to see the patient by Dr. Logan BoresEvans for evaluation of uterine embolization for dysfunctional uterine bleeding and an MRI suggesting an AV malformation.  Patient has had ongoing bleeding for many months.  This seems to be gradually worsening.  She has been seen by her gynecologist who ordered an MRI which I have reviewed which does suggest an AV malformation.  I would agree with the official report.  It is not large, but in such a vascular organ, the bleeding can be significant.  The patient reports basically persistent bleeding over the past 5 to 6 months.  She did not have any major issues up until then.  She does already have a boy and a girl at home.  She may have a third child although she is not in a hurry to have this.   Past Medical History:  Diagnosis Date  . Anxiety   . Depression   . GERD (gastroesophageal reflux disease)    NO MEDS  . Irritable bowel syndrome (IBS) since childhood  . Mental disorder    Bipolar/ Manic Depression  . Pinched nerve ongoing   in foot, with inflammation  . Thoracic outlet syndrome     Past Surgical History:  Procedure Laterality Date  . CESAREAN SECTION N/A 02/09/2017   Procedure: CESAREAN SECTION;  Surgeon: Linzie CollinEvans, David James, MD;  Location: [facility];  Service: Obstetrics;  Laterality: N/A;  . CESAREAN SECTION N/A 11/22/2018   Procedure: CESAREAN SECTION REPEAT;  Surgeon: Linzie CollinEvans, David James, MD;  Location: [facility];  Service: Obstetrics;  Laterality: N/A;  . DILATION AND EVACUATION N/A 08/10/2019   Procedure: DILATATION AND EVACUATION;  Surgeon: Linzie CollinEvans, David James, MD;  Location: [facility];  Service: Gynecology;  Laterality: N/A;  . FIRST RIB REMOVAL    . TONSILLECTOMY       Family History  Problem Relation Age of  Onset  . Hypertension Father   . Stroke Father   . Heart failure Father   . Healthy Mother   No bleeding or clotting disorders   Social History   Tobacco Use  . Smoking status: Never Smoker  . Smokeless tobacco: Never Used  Vaping Use  . Vaping Use: Every day  . Substances: Nicotine  Substance Use Topics  . Alcohol use: No  . Drug use: No    Allergies  Allergen Reactions  . Vicodin [Hydrocodone-Acetaminophen] Itching and Nausea Only  . Amoxicillin Hives and Rash    Did it involve swelling of the face/tongue/throat, SOB, or low BP? No Did it involve sudden or severe rash/hives, skin peeling, or any reaction on the inside of your mouth or nose? No Did you need to seek medical attention at a hospital or doctor's office? No When did it last happen? unknown If all above answers are "NO", may proceed with cephalosporin use. Yeast Infection  . Tramadol Nausea And Vomiting, Rash and Swelling    Current Outpatient Medications  Medication Sig Dispense Refill  . desvenlafaxine (PRISTIQ) 100 MG 24 hr tablet Take 100 mg by mouth every morning.    Marland Kitchen. ibuprofen (ADVIL) 800 MG tablet Take 1 tablet (800 mg total) by mouth every 8 (eight) hours as needed. 30 tablet 0  .  Levonorgestrel-Ethinyl Estradiol (AMETHIA) 0.15-0.03 &0.01 MG tablet Take 1 tablet by mouth at bedtime. 84 tablet 1  . SUBOXONE 8-2 MG FILM Place 1 Film under the tongue daily.     No current facility-administered medications for this visit.      REVIEW OF SYSTEMS (Negative unless checked)  Constitutional: [] Weight loss  [] Fever  [] Chills Cardiac: [] Chest pain   [] Chest pressure   [] Palpitations   [] Shortness of breath when laying flat   [] Shortness of breath at rest   [] Shortness of breath with exertion. Vascular:  [] Pain in legs with walking   [] Pain in legs at rest   [] Pain in legs when laying flat   [] Claudication   [] Pain in feet when walking  [] Pain in feet at rest  [] Pain in feet when laying flat   [] History of  DVT   [] Phlebitis   [] Swelling in legs   [] Varicose veins   [] Non-healing ulcers Pulmonary:   [] Uses home oxygen   [] Productive cough   [] Hemoptysis   [] Wheeze  [] COPD   [] Asthma Neurologic:  [] Dizziness  [] Blackouts   [] Seizures   [] History of stroke   [] History of TIA  [] Aphasia   [] Temporary blindness   [] Dysphagia   [] Weakness or numbness in arms   [] Weakness or numbness in legs Musculoskeletal:  [] Arthritis   [] Joint swelling   [] Joint pain   [] Low back pain Hematologic:  [] Easy bruising  [] Easy bleeding   [] Hypercoagulable state   [] Anemic  [] Hepatitis Gastrointestinal:  [] Blood in stool   [] Vomiting blood  [] Gastroesophageal reflux/heartburn   [] Abdominal pain Genitourinary:  [] Chronic kidney disease   [] Difficult urination  [] Frequent urination  [] Burning with urination   [] Hematuria X positive for dysfunctional uterine bleeding Skin:  [] Rashes   [] Ulcers   [] Wounds Psychological:  [] History of anxiety   []  History of major depression.    Physical Exam BP (!) 155/78 (BP Location: Right Arm)   Pulse (!) 106   Resp 16   Wt 122 lb (55.3 kg)   LMP 05/16/2019   BMI 23.05 kg/m  Gen:  WD/WN, NAD Head: [place]/AT, No temporalis wasting. Ear/Nose/Throat: Hearing grossly intact, nares w/o erythema or drainage, oropharynx w/o Erythema/Exudate Eyes: Conjunctiva clear, sclera non-icteric  Neck: trachea midline.  No JVD.  Pulmonary:  Good air movement, respirations not labored, no use of accessory muscles  Cardiac: RRR, no JVD Vascular:  Vessel Right Left  Radial Palpable Palpable                          DP  2+  2+  PT  2+  2+   Gastrointestinal:. No masses, surgical incisions, or scars. Musculoskeletal: M/S 5/5 throughout.  Extremities without ischemic changes.  No deformity or atrophy.  No edema. Neurologic: Sensation grossly intact in extremities.  Symmetrical.  Speech is fluent. Motor exam as listed above. Psychiatric: Judgment intact, Mood & affect appropriate for pt's clinical  situation. Dermatologic: No rashes or ulcers noted.  No cellulitis or open wounds.    Radiology MR PELVIS W WO CONTRAST  Result Date: 10/21/2019 CLINICAL DATA:  Vaginal bleeding with abnormal pelvic ultrasound documenting possible vascular abnormality in the myometrium. Ultrasound earlier today to investigate endometrium. EXAM: MRI PELVIS WITHOUT AND WITH CONTRAST TECHNIQUE: Multiplanar multisequence MR imaging of the pelvis was performed both before and after administration of intravenous contrast. CONTRAST:  6mL GADAVIST GADOBUTROL 1 MMOL/ML IV SOLN COMPARISON:  Pelvic ultrasound 10/14/2019. FINDINGS: Urinary Tract:  Bladder is decompressed. Bowel:  Unremarkable visualized pelvic bowel loops. Vascular/Lymphatic: No pathologically enlarged lymph nodes. No significant vascular abnormality seen. Reproductive: Uterus is retroflexed and measures 5.7 x 8.1 x 3.8 cm. Diffuse thickening of the posterior junctional zone identified up to 11 mm. There is relatively diffuse junction known thickening anteriorly with focal thickening towards the fundus measuring 12-15 mm. Heterogeneous signal is identified in the right fundal myometrium which appears to extend into a 1.9 x 2.4 x 1.3 cm lesion protruding into the endometrial cavity (see sagittal T2 image 12 series 7). Postcontrast imaging shows early arterial phase serpiginous enhancement within this lesion with curvilinear bands of hyperenhancement tracking from the right uterine fundus/adnexal region towards this abnormality (see image 30 of arterial phase axial T1 gradient series 10). No substantial fibroid burden in the uterus. Right ovary measures 2.4 x 1.3 x 1.9 cm with normal MR imaging features. Left ovary measures 1.9 x 2.7 x 1.5 cm, also unremarkable. Other:  Trace free fluid in the cul-de-sac. Musculoskeletal: No focal suspicious marrow enhancement within the visualized bony anatomy. IMPRESSION: 1. 1.9 x 2.4 x 1.3 cm heterogeneous lesion in the right fundal  endometrial cavity appears to be exophytic and protruding into the endometrial canal. Lesion is clearly hypervascular with early arterial phase postcontrast imaging documenting curvilinear hyperenhancement tracking from the right uterine fundal region into the abnormality. Vascular malformation and hypervascular endometrial polyp would be considerations. Imaging features not typical for sub mucosal fibroid. Neoplasm cannot be excluded by imaging. 2. Diffuse thickening of the junctional zone consistent with adenomyosis. Focal thickening anteriorly is compatible with focal adenomyoma. 3. Normal ovaries. 4. Trace free fluid in the cul-de-sac. Electronically Signed   By: Kennith Center M.D.   On: 10/21/2019 05:57   US PELVIC COMPLETE WITH TRANSVAGINAL  Result Date: 10/14/2019 CLINICAL DATA:  Pelvic pain, irregular bleeding, symptoms since abortion in July, underwent D&E in July for retained products EXAM: TRANSABDOMINAL AND TRANSVAGINAL ULTRASOUND OF PELVIS TECHNIQUE: Both transabdominal and transvaginal ultrasound examinations of the pelvis were performed. Transabdominal technique was performed for global imaging of the pelvis including uterus, ovaries, adnexal regions, and pelvic cul-de-sac. It was necessary to proceed with endovaginal exam following the transabdominal exam to visualize the uterus, endometrium, and ovaries. COMPARISON:  09/07/2019 FINDINGS: Uterus Measurements: 7.6 x 4.0 x 5.5 cm = volume: 87 mL. Retroverted. Heterogeneous myometrium. No focal mass. Endometrium Thickness: 12 mm. Mildly heterogeneous at upper uterine segment with question several tiny cystic foci. No focal mass or endometrial fluid definitely visualized. Right ovary Measurements: 2.7 x 1.5 x 2.0 cm = volume: 4.3 mL. Normal morphology without mass Left ovary Measurements: 2.8 x 1.1 x 1.1 cm = volume: 1.7 mL. Normal morphology without mass Other findings No free pelvic fluid.  No adnexal masses. IMPRESSION: Mildly heterogeneous  endometrial complex 12 mm thick with question several tiny cystic foci. No additional focal uterine or ovarian abnormalities identified. Electronically Signed   By: Ulyses Southward M.D.   On: 10/14/2019 15:55    Labs Recent Results (from the past 2160 hour(s))  POCT urinalysis dipstick     Status: None   Collection Time: 08/05/19 11:37 AM  Result Value Ref Range   Color, UA red    Clarity, UA clear    Glucose, UA Negative Negative   Bilirubin, UA neg    Ketones, UA neg    Spec Grav, UA 1.010 1.010 - 1.025   Blood, UA large    pH, UA 6.5 5.0 - 8.0   Protein, UA Negative Negative  Urobilinogen, UA 0.2 0.2 or 1.0 E.U./dL   Nitrite, UA neg    Leukocytes, UA Negative Negative   Appearance     Odor    SARS Coronavirus 2 by RT PCR (hospital order, performed in [facility] hospital lab) Nasopharyngeal Nasopharyngeal Swab     Status: Abnormal   Collection Time: 08/10/19 12:08 PM   Specimen: Nasopharyngeal Swab  Result Value Ref Range   SARS Coronavirus 2 POSITIVE (A) NEGATIVE    Comment: RESULT CALLED TO, READ BACK BY AND VERIFIED WITH: KAREN REGISTER 08/10/19 1413 KLW (NOTE) SARS-CoV-2 target nucleic acids are DETECTED  SARS-CoV-2 RNA is generally detectable in upper respiratory specimens  during the acute phase of infection.  Positive results are indicative  of the presence of the identified virus, but do not rule out bacterial infection or co-infection with other pathogens not detected by the test.  Clinical correlation with patient history and  other diagnostic information is necessary to determine patient infection status.  The expected result is negative.  Fact Sheet for Patients:   BoilerBrush.com.cy   Fact Sheet for Healthcare Providers:   https://pope.com/    This test is not yet approved or cleared by the [place] FDA and  has been authorized for detection and/or diagnosis of SARS-CoV-2 by FDA under an Emergency Use  Authorization (EUA).  This EUA will remain in effect (meaning this test  can be used) for the duration of  the COVID-19 declaration under Section 564(b)(1) of the Act, 21 U.S.C. section 360-bbb-3(b)(1), unless the authorization is terminated or revoked sooner.  Performed at [facility], [address] [place], [place] [address]   Urine Drug Screen, Qualitative [facility] only)     Status: Abnormal   Collection Time: 08/10/19 12:38 PM  Result Value Ref Range   Tricyclic, Ur Screen NONE DETECTED NONE DETECTED   Amphetamines, Ur Screen NONE DETECTED NONE DETECTED   MDMA (Ecstasy)Ur Screen NONE DETECTED NONE DETECTED   Cocaine Metabolite,Ur [place] NONE DETECTED NONE DETECTED   Opiate, Ur Screen NONE DETECTED NONE DETECTED   Phencyclidine (PCP) Ur S NONE DETECTED NONE DETECTED   Cannabinoid 50 Ng, Ur [place] NONE DETECTED NONE DETECTED   Barbiturates, Ur Screen NONE DETECTED NONE DETECTED   Benzodiazepine, Ur Scrn POSITIVE (A) NONE DETECTED   Methadone Scn, Ur NONE DETECTED NONE DETECTED    Comment: (NOTE) Tricyclics + metabolites, urine    Cutoff 1000 ng/mL Amphetamines + metabolites, urine  Cutoff 1000 ng/mL MDMA (Ecstasy), urine              Cutoff 500 ng/mL Cocaine Metabolite, urine          Cutoff 300 ng/mL Opiate + metabolites, urine        Cutoff 300 ng/mL Phencyclidine (PCP), urine         Cutoff 25 ng/mL Cannabinoid, urine                 Cutoff 50 ng/mL Barbiturates + metabolites, urine  Cutoff 200 ng/mL Benzodiazepine, urine              Cutoff 200 ng/mL Methadone, urine                   Cutoff 300 ng/mL  The urine drug screen provides only a preliminary, unconfirmed analytical test result and should not be used for non-medical purposes. Clinical consideration and professional judgment should be applied to any positive drug screen result due to possible interfering substances. A more specific alternate chemical  method must be used in order to obtain a confirmed analytical  result. Gas chromatography / mass spectrometry (GC/MS) is the preferred confirm atory method. Performed at [facility], [address] [place], [place] [address]   CBC     Status: Abnormal   Collection Time: 08/10/19 12:46 PM  Result Value Ref Range   WBC 3.0 (L) 4.0 - 10.5 K/uL   RBC 4.38 3.87 - 5.11 MIL/uL   Hemoglobin 12.2 12.0 - 15.0 g/dL   HCT 60.4 36 - 46 %   MCV 83.6 80.0 - 100.0 fL   MCH 27.9 26.0 - 34.0 pg   MCHC 33.3 30.0 - 36.0 g/dL   RDW 54.0 98.1 - 19.1 %   Platelets 107 (L) 150 - 400 K/uL    Comment: Immature Platelet Fraction may be clinically indicated, consider ordering this additional test [id number]    nRBC 0.0 0.0 - 0.2 %    Comment: Performed at [facility], [address] [place], [place] [address]  Type and screen     Status: None   Collection Time: 08/10/19 12:46 PM  Result Value Ref Range   ABO/RH(D) O POS    Antibody Screen NEG    Sample Expiration      08/13/2019,2359 Performed at [facility], [address], [place], [place] [address]   Surgical pathology     Status: None   Collection Time: 08/10/19  3:41 PM  Result Value Ref Range   SURGICAL PATHOLOGY      SURGICAL PATHOLOGY CASE: [phone] PATIENT: Renee Ramus Surgical Pathology Report     Specimen Submitted: A. Uterine contents  Clinical History: Incomplete AB.  Covid patient      DIAGNOSIS: A. UTERINE CONTENTS; DILATATION AND EVACUATION: - CHORIONIC VILLI, DECIDUA, AND GESTATIONAL ENDOMETRIUM. - NEGATIVE FOR MALIGNANCY.   GROSS DESCRIPTION: A. Labeled: Uterine content Received: Formalin Tissue fragment(s): Multiple Size: Aggregate, 9.0 x 2.5 x 0.5 cm Description: Received are irregular fragments of tan-brown soft tissue admixed with hemorrhagic material.  No villous tissue or fetal parts are grossly identified.  No abnormalities are grossly identified. Entirely submitted in cassettes 1-4.     Final Diagnosis performed by  Georgeanna Harrison, MD.   Electronically signed 08/12/2019 9:48:44AM The electronic signature indicates that the named Attending Pathologist has evaluated the specimen Technical component performed at [facility], [address] [place], [place], [place] [address] Lab: [phone] Dir: Jolene Schimke, MD, MMM  Professional component performed at [facility], [address] [place], [place], [place] [address] Lab: [phone] Dir: Georgiann Cocker. Oneita Kras, MD   Beta hCG quant (ref lab)     Status: None   Collection Time: 09/07/19  3:30 PM  Result Value Ref Range   hCG Quant 8 mIU/mL    Comment:                      Female (Non-pregnant)    0 -     5                             (Postmenopausal)  0 -     8                      Female (Pregnant)                      Weeks of Gestation  3                6 -    71                              4               10 -   750                              5              217 -  7138                              6              158 - 31795                              7             3697 -163563                              8            32065 -149571                              9            63803 -151410                             10            46509 -186977                             12            27832 -210612                             14            13950 - 62530                             15            12039 - 70971                             16             9040 - 56451                             17             8175 - 55868                             18             8099 - 58176 [employer] E CLIA methodology  Assessment/Plan:  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    Bipolar affective disorder in remission (HCC) Mood and affect appear normal today.  Dysfunctional uterine bleeding Has had persistent uterine bleeding for 5 to 6 months now.  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  We have discussed options for treatment and her gynecologist has recommended consideration for embolization.  I think that would be an excellent option for treatment.  This would be far less invasive than hysterectomy.  We discussed that uterine artery embolization even if we embolize the entire uterine artery often folks can still have pregnancies down the line.  I told her I would not expect her to be able to be pregnant for several months at least.  I will try to be more selective since we are finding a discrete lesion and hopefully can avoid having to do extensive uterine artery embolization to the entire uterus.  Risks and benefits as well as how the procedure would be performed were discussed with the patient today.  She is agreeable to proceed.  Uterine mass Has had persistent uterine bleeding for 5 to 6 months now.  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  We have discussed options for treatment and her gynecologist has recommended consideration for embolization.  I think that would be an excellent option for treatment.  This would be far less invasive than hysterectomy.  We discussed that uterine artery embolization even if we embolize the entire uterine artery often folks can still have pregnancies down the line.  I told her I would not expect her to be able to be pregnant for several months at least.  I will try to be more selective since we are finding a discrete lesion and hopefully can avoid having to do extensive uterine artery embolization to the entire uterus.  Risks and benefits as well as how the procedure would be performed were discussed with the patient today.  She is agreeable to proceed.      Festus Barren 10/28/2019, 12:18 PM   This note was created with Dragon medical transcription system.  Any errors from dictation are unintentional.

## 2019-10-28 NOTE — Assessment & Plan Note (Signed)
Mood and affect appear normal today.

## 2019-10-28 NOTE — Assessment & Plan Note (Signed)
Had this resected by pediatric surgeon 6 years ago.

## 2019-10-28 NOTE — Patient Instructions (Signed)
Embolization Embolization is a procedure that is done to block one or more blood vessels. This is done by injecting a type of medicine or synthetic material (embolic agent) into an artery or vein through a long, flexible tube (catheter). The embolic agent stops blood flow through the artery or vein. This procedure may be done to:  Stop bleeding inside the body.  Cut off the blood supply to a tumor or an abnormal growth of blood vessels.  Treat blood vessels that are weak, bulging, leaking, or torn (aneurysm). Tell a health care provider about:  Any allergies you have.  All medicines you are taking, including vitamins, herbs, eye drops, creams, and over-the-counter medicines.  Any problems you or family members have had with anesthetic medicines.  Any blood disorders you have.  Any surgeries you have had.  Any medical conditions you have or have had, especially diabetes or kidney problems.  Whether you are pregnant or may be pregnant. What are the risks? Generally, this is a safe procedure. However, problems may occur, including:  Infection.  Blood clots.  Allergic reaction to medicines or dye.  Damage to the blood vessel.  Kidney damage. What happens before the procedure? Medicines Ask your health care provider about:  Changing or stopping your regular medicines. This is especially important if you are taking diabetes medicines or blood thinners.  Taking medicines such as aspirin and ibuprofen. These medicines can thin your blood. Do not take these medicines unless your health care provider tells you to take them.  Taking over-the-counter medicines, vitamins, herbs, and supplements. Staying hydrated Follow instructions from your health care provider about hydration, which may include:  Up to 2 hours before the procedure - you may continue to drink clear liquids, such as water, clear fruit juice, black coffee, and plain tea.  Eating and drinking restrictions Follow  instructions from your health care provider about eating and drinking, which may include:  8 hours before the procedure - stop eating heavy meals or foods, such as meat, fried foods, or fatty foods.  6 hours before the procedure - stop eating light meals or foods, such as toast or cereal.  6 hours before the procedure - stop drinking milk or drinks that contain milk.  2 hours before the procedure - stop drinking clear liquids. General instructions  You may have blood tests. These tests will check: ? How well your liver and kidneys are working. ? Whether your blood clots in the right way.  Plan to have someone take you home from the hospital or clinic.  Do not use any products that contain nicotine or tobacco for at least 4-6 weeks before the procedure. These products include cigarettes, e-cigarettes, and chewing tobacco. If you need help quitting, ask your health care provider.  Ask your health care provider what steps will be taken to help prevent infection. These may include: ? Removing hair at the surgery site. ? Washing skin with a germ-killing soap. ? Taking antibiotic medicine. What happens during the procedure?   An IV will be inserted into one of your veins.  You will be given one or more of the following: ? A medicine to help you relax (sedative). ? A medicine to numb the area (local anesthetic). ? A medicine to make you fall asleep (general anesthetic).  A needle will be inserted into one of the large blood vessels in your groin (femoral blood vessel).  A catheter will be inserted into the needle and guided to the area that  needs to be treated.  Dye will be injected through the IV, and X-rays will be taken. This helps to show the exact location of the blood vessels that are causing the problem.  The embolic agent will then be injected into the blood vessel.  More X-rays will be taken to make sure the blood vessel has been blocked.  The catheter will be removed,  and pressure will be applied to the incision to stop any bleeding.  A bandage (dressing) will be applied. The procedure may vary among health care providers and hospitals. What happens after the procedure?  Your blood pressure, heart rate, breathing rate, and blood oxygen level will be monitored until you leave the hospital or clinic.  You will be given medicine to help relieve pain or nausea as needed.  You will need to stay lying down until your health care provider says it is safe for you to get up.  Do not drive for 24 hours if you were given a sedative during your procedure. Summary  Embolization is a procedure that is done to block one or more blood vessels by injecting a type of medicine or synthetic material (embolic agent) into an artery or vein through a long, flexible tube (catheter).  During the procedure, dye will be injected through your IV, and X-rays will be taken. This helps to show the exact location of the blood vessels that are causing the problem.  Before the procedure, ask your health care provider about changing or stopping your normal medicines.  Do not drive for 24 hours if you were given a sedative during your procedure. This information is not intended to replace advice given to you by your health care provider. Make sure you discuss any questions you have with your health care provider. Document Revised: 05/19/2018 Document Reviewed: 08/31/2017 Elsevier Patient Education  2020 [employer].

## 2019-10-28 NOTE — Assessment & Plan Note (Signed)
Has had persistent uterine bleeding for 5 to 6 months now.  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  We have discussed options for treatment and her gynecologist has recommended consideration for embolization.  I think that would be an excellent option for treatment.  This would be far less invasive than hysterectomy.  We discussed that uterine artery embolization even if we embolize the entire uterine artery often folks can still have pregnancies down the line.  I told her I would not expect her to be able to be pregnant for several months at least.  I will try to be more selective since we are finding a discrete lesion and hopefully can avoid having to do extensive uterine artery embolization to the entire uterus.  Risks and benefits as well as how the procedure would be performed were discussed with the patient today.  She is agreeable to proceed. 

## 2019-10-28 NOTE — Assessment & Plan Note (Signed)
Has had persistent uterine bleeding for 5 to 6 months now.  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  We have discussed options for treatment and her gynecologist has recommended consideration for embolization.  I think that would be an excellent option for treatment.  This would be far less invasive than hysterectomy.  We discussed that uterine artery embolization even if we embolize the entire uterine artery often folks can still have pregnancies down the line.  I told her I would not expect her to be able to be pregnant for several months at least.  I will try to be more selective since we are finding a discrete lesion and hopefully can avoid having to do extensive uterine artery embolization to the entire uterus.  Risks and benefits as well as how the procedure would be performed were discussed with the patient today.  She is agreeable to proceed.

## 2019-10-28 NOTE — Progress Notes (Signed)
  Patient ID: Jaclyn Kelly, female   DOB: 01/28/2000, 19 y.o.   MRN: [id number]  Chief Complaint  Patient presents with  . New Patient (Initial Visit)    add on per Nakai Yard    HPI Jaclyn Kelly is a 19 y.o. female.  I am asked to see the patient by Dr. Evans for evaluation of uterine embolization for dysfunctional uterine bleeding and an MRI suggesting an AV malformation.  Patient has had ongoing bleeding for many months.  This seems to be gradually worsening.  She has been seen by her gynecologist who ordered an MRI which I have reviewed which does suggest an AV malformation.  I would agree with the official report.  It is not large, but in such a vascular organ, the bleeding can be significant.  The patient reports basically persistent bleeding over the past 5 to 6 months.  She did not have any major issues up until then.  She does already have a boy and a girl at home.  She may have a third child although she is not in a hurry to have this.   Past Medical History:  Diagnosis Date  . Anxiety   . Depression   . GERD (gastroesophageal reflux disease)    NO MEDS  . Irritable bowel syndrome (IBS) since childhood  . Mental disorder    Bipolar/ Manic Depression  . Pinched nerve ongoing   in foot, with inflammation  . Thoracic outlet syndrome     Past Surgical History:  Procedure Laterality Date  . CESAREAN SECTION N/A 02/09/2017   Procedure: CESAREAN SECTION;  Surgeon: Evans, David James, MD;  Location: [facility] ORS;  Service: Obstetrics;  Laterality: N/A;  . CESAREAN SECTION N/A 11/22/2018   Procedure: CESAREAN SECTION REPEAT;  Surgeon: Evans, David James, MD;  Location: [facility] ORS;  Service: Obstetrics;  Laterality: N/A;  . DILATION AND EVACUATION N/A 08/10/2019   Procedure: DILATATION AND EVACUATION;  Surgeon: Evans, David James, MD;  Location: [facility] ORS;  Service: Gynecology;  Laterality: N/A;  . FIRST RIB REMOVAL    . TONSILLECTOMY       Family History  Problem Relation Age of  Onset  . Hypertension Father   . Stroke Father   . Heart failure Father   . Healthy Mother   No bleeding or clotting disorders   Social History   Tobacco Use  . Smoking status: Never Smoker  . Smokeless tobacco: Never Used  Vaping Use  . Vaping Use: Every day  . Substances: Nicotine  Substance Use Topics  . Alcohol use: No  . Drug use: No    Allergies  Allergen Reactions  . Vicodin [Hydrocodone-Acetaminophen] Itching and Nausea Only  . Amoxicillin Hives and Rash    Did it involve swelling of the face/tongue/throat, SOB, or low BP? No Did it involve sudden or severe rash/hives, skin peeling, or any reaction on the inside of your mouth or nose? No Did you need to seek medical attention at a hospital or doctor's office? No When did it last happen? unknown If all above answers are "NO", may proceed with cephalosporin use. Yeast Infection  . Tramadol Nausea And Vomiting, Rash and Swelling    Current Outpatient Medications  Medication Sig Dispense Refill  . desvenlafaxine (PRISTIQ) 100 MG 24 hr tablet Take 100 mg by mouth every morning.    . ibuprofen (ADVIL) 800 MG tablet Take 1 tablet (800 mg total) by mouth every 8 (eight) hours as needed. 30 tablet 0  .   Levonorgestrel-Ethinyl Estradiol (AMETHIA) 0.15-0.03 &0.01 MG tablet Take 1 tablet by mouth at bedtime. 84 tablet 1  . SUBOXONE 8-2 MG FILM Place 1 Film under the tongue daily.     No current facility-administered medications for this visit.      REVIEW OF SYSTEMS (Negative unless checked)  Constitutional: []Weight loss  []Fever  []Chills Cardiac: []Chest pain   []Chest pressure   []Palpitations   []Shortness of breath when laying flat   []Shortness of breath at rest   []Shortness of breath with exertion. Vascular:  []Pain in legs with walking   []Pain in legs at rest   []Pain in legs when laying flat   []Claudication   []Pain in feet when walking  []Pain in feet at rest  []Pain in feet when laying flat   []History of  DVT   []Phlebitis   []Swelling in legs   []Varicose veins   []Non-healing ulcers Pulmonary:   []Uses home oxygen   []Productive cough   []Hemoptysis   []Wheeze  []COPD   []Asthma Neurologic:  []Dizziness  []Blackouts   []Seizures   []History of stroke   []History of TIA  []Aphasia   []Temporary blindness   []Dysphagia   []Weakness or numbness in arms   []Weakness or numbness in legs Musculoskeletal:  []Arthritis   []Joint swelling   []Joint pain   []Low back pain Hematologic:  []Easy bruising  []Easy bleeding   []Hypercoagulable state   []Anemic  []Hepatitis Gastrointestinal:  []Blood in stool   []Vomiting blood  []Gastroesophageal reflux/heartburn   []Abdominal pain Genitourinary:  []Chronic kidney disease   []Difficult urination  []Frequent urination  []Burning with urination   []Hematuria X positive for dysfunctional uterine bleeding Skin:  []Rashes   []Ulcers   []Wounds Psychological:  []History of anxiety   [] History of major depression.    Physical Exam BP (!) 155/78 (BP Location: Right Arm)   Pulse (!) 106   Resp 16   Wt 122 lb (55.3 kg)   LMP 05/16/2019   BMI 23.05 kg/m  Gen:  WD/WN, NAD Head: [place]/AT, No temporalis wasting. Ear/Nose/Throat: Hearing grossly intact, nares w/o erythema or drainage, oropharynx w/o Erythema/Exudate Eyes: Conjunctiva clear, sclera non-icteric  Neck: trachea midline.  No JVD.  Pulmonary:  Good air movement, respirations not labored, no use of accessory muscles  Cardiac: RRR, no JVD Vascular:  Vessel Right Left  Radial Palpable Palpable                          DP  2+  2+  PT  2+  2+   Gastrointestinal:. No masses, surgical incisions, or scars. Musculoskeletal: M/S 5/5 throughout.  Extremities without ischemic changes.  No deformity or atrophy.  No edema. Neurologic: Sensation grossly intact in extremities.  Symmetrical.  Speech is fluent. Motor exam as listed above. Psychiatric: Judgment intact, Mood & affect appropriate for pt's clinical  situation. Dermatologic: No rashes or ulcers noted.  No cellulitis or open wounds.    Radiology MR PELVIS W WO CONTRAST  Result Date: 10/21/2019 CLINICAL DATA:  Vaginal bleeding with abnormal pelvic ultrasound documenting possible vascular abnormality in the myometrium. Ultrasound earlier today to investigate endometrium. EXAM: MRI PELVIS WITHOUT AND WITH CONTRAST TECHNIQUE: Multiplanar multisequence MR imaging of the pelvis was performed both before and after administration of intravenous contrast. CONTRAST:  6mL GADAVIST GADOBUTROL 1 MMOL/ML IV SOLN COMPARISON:  Pelvic ultrasound 10/14/2019. FINDINGS: Urinary Tract:  Bladder is decompressed. Bowel:    Unremarkable visualized pelvic bowel loops. Vascular/Lymphatic: No pathologically enlarged lymph nodes. No significant vascular abnormality seen. Reproductive: Uterus is retroflexed and measures 5.7 x 8.1 x 3.8 cm. Diffuse thickening of the posterior junctional zone identified up to 11 mm. There is relatively diffuse junction known thickening anteriorly with focal thickening towards the fundus measuring 12-15 mm. Heterogeneous signal is identified in the right fundal myometrium which appears to extend into a 1.9 x 2.4 x 1.3 cm lesion protruding into the endometrial cavity (see sagittal T2 image 12 series 7). Postcontrast imaging shows early arterial phase serpiginous enhancement within this lesion with curvilinear bands of hyperenhancement tracking from the right uterine fundus/adnexal region towards this abnormality (see image 30 of arterial phase axial T1 gradient series 10). No substantial fibroid burden in the uterus. Right ovary measures 2.4 x 1.3 x 1.9 cm with normal MR imaging features. Left ovary measures 1.9 x 2.7 x 1.5 cm, also unremarkable. Other:  Trace free fluid in the cul-de-sac. Musculoskeletal: No focal suspicious marrow enhancement within the visualized bony anatomy. IMPRESSION: 1. 1.9 x 2.4 x 1.3 cm heterogeneous lesion in the right fundal  endometrial cavity appears to be exophytic and protruding into the endometrial canal. Lesion is clearly hypervascular with early arterial phase postcontrast imaging documenting curvilinear hyperenhancement tracking from the right uterine fundal region into the abnormality. Vascular malformation and hypervascular endometrial polyp would be considerations. Imaging features not typical for sub mucosal fibroid. Neoplasm cannot be excluded by imaging. 2. Diffuse thickening of the junctional zone consistent with adenomyosis. Focal thickening anteriorly is compatible with focal adenomyoma. 3. Normal ovaries. 4. Trace free fluid in the cul-de-sac. Electronically Signed   By: Eric  Mansell M.D.   On: 10/21/2019 05:57   US PELVIC COMPLETE WITH TRANSVAGINAL  Result Date: 10/14/2019 CLINICAL DATA:  Pelvic pain, irregular bleeding, symptoms since abortion in July, underwent D&E in July for retained products EXAM: TRANSABDOMINAL AND TRANSVAGINAL ULTRASOUND OF PELVIS TECHNIQUE: Both transabdominal and transvaginal ultrasound examinations of the pelvis were performed. Transabdominal technique was performed for global imaging of the pelvis including uterus, ovaries, adnexal regions, and pelvic cul-de-sac. It was necessary to proceed with endovaginal exam following the transabdominal exam to visualize the uterus, endometrium, and ovaries. COMPARISON:  09/07/2019 FINDINGS: Uterus Measurements: 7.6 x 4.0 x 5.5 cm = volume: 87 mL. Retroverted. Heterogeneous myometrium. No focal mass. Endometrium Thickness: 12 mm. Mildly heterogeneous at upper uterine segment with question several tiny cystic foci. No focal mass or endometrial fluid definitely visualized. Right ovary Measurements: 2.7 x 1.5 x 2.0 cm = volume: 4.3 mL. Normal morphology without mass Left ovary Measurements: 2.8 x 1.1 x 1.1 cm = volume: 1.7 mL. Normal morphology without mass Other findings No free pelvic fluid.  No adnexal masses. IMPRESSION: Mildly heterogeneous  endometrial complex 12 mm thick with question several tiny cystic foci. No additional focal uterine or ovarian abnormalities identified. Electronically Signed   By: Mark  Boles M.D.   On: 10/14/2019 15:55    Labs Recent Results (from the past 2160 hour(s))  POCT urinalysis dipstick     Status: None   Collection Time: 08/05/19 11:37 AM  Result Value Ref Range   Color, UA red    Clarity, UA clear    Glucose, UA Negative Negative   Bilirubin, UA neg    Ketones, UA neg    Spec Grav, UA 1.010 1.010 - 1.025   Blood, UA large    pH, UA 6.5 5.0 - 8.0   Protein, UA Negative Negative     Urobilinogen, UA 0.2 0.2 or 1.0 E.U./dL   Nitrite, UA neg    Leukocytes, UA Negative Negative   Appearance     Odor    SARS Coronavirus 2 by RT PCR (hospital order, performed in [place] hospital lab) Nasopharyngeal Nasopharyngeal Swab     Status: Abnormal   Collection Time: 08/10/19 12:08 PM   Specimen: Nasopharyngeal Swab  Result Value Ref Range   SARS Coronavirus 2 POSITIVE (A) NEGATIVE    Comment: RESULT CALLED TO, READ BACK BY AND VERIFIED WITH: KAREN REGISTER 08/10/19 1413 KLW (NOTE) SARS-CoV-2 target nucleic acids are DETECTED  SARS-CoV-2 RNA is generally detectable in upper respiratory specimens  during the acute phase of infection.  Positive results are indicative  of the presence of the identified virus, but do not rule out bacterial infection or co-infection with other pathogens not detected by the test.  Clinical correlation with patient history and  other diagnostic information is necessary to determine patient infection status.  The expected result is negative.  Fact Sheet for Patients:   https://www.fda.gov/media/136312/download   Fact Sheet for Healthcare Providers:   https://www.fda.gov/media/136313/download    This test is not yet approved or cleared by the [place] FDA and  has been authorized for detection and/or diagnosis of SARS-CoV-2 by FDA under an Emergency Use  Authorization (EUA).  This EUA will remain in effect (meaning this test  can be used) for the duration of  the COVID-19 declaration under Section 564(b)(1) of the Act, 21 U.S.C. section 360-bbb-3(b)(1), unless the authorization is terminated or revoked sooner.  Performed at [facility], [address] [place], [place] [address]   Urine Drug Screen, Qualitative ([facility] only)     Status: Abnormal   Collection Time: 08/10/19 12:38 PM  Result Value Ref Range   Tricyclic, Ur Screen NONE DETECTED NONE DETECTED   Amphetamines, Ur Screen NONE DETECTED NONE DETECTED   MDMA (Ecstasy)Ur Screen NONE DETECTED NONE DETECTED   Cocaine Metabolite,Ur [place] NONE DETECTED NONE DETECTED   Opiate, Ur Screen NONE DETECTED NONE DETECTED   Phencyclidine (PCP) Ur S NONE DETECTED NONE DETECTED   Cannabinoid 50 Ng, Ur [place] NONE DETECTED NONE DETECTED   Barbiturates, Ur Screen NONE DETECTED NONE DETECTED   Benzodiazepine, Ur Scrn POSITIVE (A) NONE DETECTED   Methadone Scn, Ur NONE DETECTED NONE DETECTED    Comment: (NOTE) Tricyclics + metabolites, urine    Cutoff 1000 ng/mL Amphetamines + metabolites, urine  Cutoff 1000 ng/mL MDMA (Ecstasy), urine              Cutoff 500 ng/mL Cocaine Metabolite, urine          Cutoff 300 ng/mL Opiate + metabolites, urine        Cutoff 300 ng/mL Phencyclidine (PCP), urine         Cutoff 25 ng/mL Cannabinoid, urine                 Cutoff 50 ng/mL Barbiturates + metabolites, urine  Cutoff 200 ng/mL Benzodiazepine, urine              Cutoff 200 ng/mL Methadone, urine                   Cutoff 300 ng/mL  The urine drug screen provides only a preliminary, unconfirmed analytical test result and should not be used for non-medical purposes. Clinical consideration and professional judgment should be applied to any positive drug screen result due to possible interfering substances. A more specific alternate chemical   method must be used in order to obtain a confirmed analytical  result. Gas chromatography / mass spectrometry (GC/MS) is the preferred confirm atory method. Performed at [facility], [address] [place], [place] [address]   CBC     Status: Abnormal   Collection Time: 08/10/19 12:46 PM  Result Value Ref Range   WBC 3.0 (L) 4.0 - 10.5 K/uL   RBC 4.38 3.87 - 5.11 MIL/uL   Hemoglobin 12.2 12.0 - 15.0 g/dL   HCT 36.6 36 - 46 %   MCV 83.6 80.0 - 100.0 fL   MCH 27.9 26.0 - 34.0 pg   MCHC 33.3 30.0 - 36.0 g/dL   RDW 13.2 11.5 - 15.5 %   Platelets 107 (L) 150 - 400 K/uL    Comment: Immature Platelet Fraction may be clinically indicated, consider ordering this additional test [id number]    nRBC 0.0 0.0 - 0.2 %    Comment: Performed at [facility], [address] [place], [place] [address]  Type and screen     Status: None   Collection Time: 08/10/19 12:46 PM  Result Value Ref Range   ABO/RH(D) O POS    Antibody Screen NEG    Sample Expiration      08/13/2019,2359 Performed at [facility], [address] [place], [place] [address]   Surgical pathology     Status: None   Collection Time: 08/10/19  3:41 PM  Result Value Ref Range   SURGICAL PATHOLOGY      SURGICAL PATHOLOGY CASE: [id number] PATIENT: Denali Braithwaite Surgical Pathology Report     Specimen Submitted: A. Uterine contents  Clinical History: Incomplete AB.  Covid patient      DIAGNOSIS: A. UTERINE CONTENTS; DILATATION AND EVACUATION: - CHORIONIC VILLI, DECIDUA, AND GESTATIONAL ENDOMETRIUM. - NEGATIVE FOR MALIGNANCY.   GROSS DESCRIPTION: A. Labeled: Uterine content Received: Formalin Tissue fragment(s): Multiple Size: Aggregate, 9.0 x 2.5 x 0.5 cm Description: Received are irregular fragments of tan-brown soft tissue admixed with hemorrhagic material.  No villous tissue or fetal parts are grossly identified.  No abnormalities are grossly identified. Entirely submitted in cassettes 1-4.     Final Diagnosis performed by  Alyssa Kraynie, MD.   Electronically signed 08/12/2019 9:48:44AM The electronic signature indicates that the named Attending Pathologist has evaluated the specimen Technical component performed at [facility], [address], [place], [place] [address] Lab: [phone] Dir: Sanjai Nagendra, MD, MMM  Professional component performed at [facility],  [facility], [address], [place], [place] [address] Lab: [phone] Dir: Tara C. Rubinas, MD   Beta hCG quant (ref lab)     Status: None   Collection Time: 09/07/19  3:30 PM  Result Value Ref Range   hCG Quant 8 mIU/mL    Comment:                      Female (Non-pregnant)    0 -     5                             (Postmenopausal)  0 -     8                      Female (Pregnant)                      Weeks of Gestation                                3                6 -    71                              4               10 -   750                              5              217 -  7138                              6              158 - 31795                              7             3697 -163563                              8            32065 -149571                              9            63803 -151410                             10            46509 -186977                             12            27832 -210612                             14            13950 - 62530                             15            12039 - 70971                             16             9040 - 56451                             17             8175 - 55868                             18             8099 - 58176 [employer] E CLIA methodology       Assessment/Plan:  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    Bipolar affective disorder in remission (HCC) Mood and affect appear normal today.  Dysfunctional uterine bleeding Has had persistent uterine bleeding for 5 to 6 months now.  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  We have discussed options for treatment and her gynecologist has recommended consideration for embolization.  I think that would be an excellent option for treatment.  This would be far less invasive than hysterectomy.  We discussed that uterine artery embolization even if we embolize the entire uterine artery often folks can still have pregnancies down the line.  I told her I would not expect her to be able to be pregnant for several months at least.  I will try to be more selective since we are finding a discrete lesion and hopefully can avoid having to do extensive uterine artery embolization to the entire uterus.  Risks and benefits as well as how the procedure would be performed were discussed with the patient today.  She is agreeable to proceed.  Uterine mass Has had persistent uterine bleeding for 5 to 6 months now.  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  We have discussed options for treatment and her gynecologist has recommended consideration for embolization.  I think that would be an excellent option for treatment.  This would be far less invasive than hysterectomy.  We discussed that uterine artery embolization even if we embolize the entire uterine artery often folks can still have pregnancies down the line.  I told her I would not expect her to be able to be pregnant for several months at least.  I will try to be more selective since we are finding a discrete lesion and hopefully can avoid having to do extensive uterine artery embolization to the entire uterus.  Risks and benefits as well as how the procedure would be performed were discussed with the patient today.  She is agreeable to proceed.      Deagan Sevin 10/28/2019, 12:18 PM   This note was created with Dragon medical transcription system.  Any errors from dictation are unintentional.   

## 2019-10-31 ENCOUNTER — Encounter (INDEPENDENT_AMBULATORY_CARE_PROVIDER_SITE_OTHER): Payer: Self-pay

## 2019-10-31 ENCOUNTER — Telehealth (INDEPENDENT_AMBULATORY_CARE_PROVIDER_SITE_OTHER): Payer: Self-pay

## 2019-10-31 NOTE — Telephone Encounter (Signed)
Spoke with the patient and she is scheduled for a uterine artery embolization with Dr. Wyn Quaker on 11/03/19 with a 11:00 am arrival time to the [facility]. Covid testing on 11/01/19 between 8-1 pm at the [facility]. Pre-procedure instructions were discussed and the patient will pick up.

## 2019-11-01 ENCOUNTER — Other Ambulatory Visit: Payer: Self-pay

## 2019-11-01 ENCOUNTER — Other Ambulatory Visit
Admission: RE | Admit: 2019-11-01 | Discharge: 2019-11-01 | Disposition: A | Discharge status: Home / Self Care | Payer: Medicaid Other | Source: Ambulatory Visit | Attending: Vascular Surgery | Admitting: Vascular Surgery

## 2019-11-01 DIAGNOSIS — Z01812 Encounter for preprocedural laboratory examination: Secondary | ICD-10-CM | POA: Diagnosis not present

## 2019-11-01 DIAGNOSIS — Z20822 Contact with and (suspected) exposure to covid-19: Secondary | ICD-10-CM | POA: Insufficient documentation

## 2019-11-01 LAB — SARS CORONAVIRUS 2 (TAT 6-24 HRS): SARS Coronavirus 2: NEGATIVE

## 2019-11-03 ENCOUNTER — Other Ambulatory Visit (INDEPENDENT_AMBULATORY_CARE_PROVIDER_SITE_OTHER): Payer: Self-pay | Admitting: Nurse Practitioner

## 2019-11-03 ENCOUNTER — Encounter (INDEPENDENT_AMBULATORY_CARE_PROVIDER_SITE_OTHER): Payer: Self-pay

## 2019-11-03 ENCOUNTER — Encounter: Payer: Self-pay | Admitting: Anesthesiology

## 2019-11-03 ENCOUNTER — Ambulatory Visit
Admission: RE | Admit: 2019-11-03 | Discharge: 2019-11-03 | Disposition: A | Discharge status: Home / Self Care | Payer: Medicaid Other | Attending: Vascular Surgery | Admitting: Vascular Surgery

## 2019-11-03 ENCOUNTER — Encounter
Admission: RE | Disposition: A | Discharge status: Home / Self Care | Payer: Self-pay | Source: Home / Self Care | Attending: Vascular Surgery

## 2019-11-03 DIAGNOSIS — Z793 Long term (current) use of hormonal contraceptives: Secondary | ICD-10-CM | POA: Insufficient documentation

## 2019-11-03 DIAGNOSIS — K589 Irritable bowel syndrome without diarrhea: Secondary | ICD-10-CM | POA: Diagnosis not present

## 2019-11-03 DIAGNOSIS — K219 Gastro-esophageal reflux disease without esophagitis: Secondary | ICD-10-CM | POA: Diagnosis not present

## 2019-11-03 DIAGNOSIS — N938 Other specified abnormal uterine and vaginal bleeding: Secondary | ICD-10-CM | POA: Insufficient documentation

## 2019-11-03 DIAGNOSIS — N858 Other specified noninflammatory disorders of uterus: Secondary | ICD-10-CM

## 2019-11-03 DIAGNOSIS — Z79899 Other long term (current) drug therapy: Secondary | ICD-10-CM | POA: Insufficient documentation

## 2019-11-03 DIAGNOSIS — F319 Bipolar disorder, unspecified: Secondary | ICD-10-CM | POA: Insufficient documentation

## 2019-11-03 DIAGNOSIS — Z885 Allergy status to narcotic agent status: Secondary | ICD-10-CM | POA: Insufficient documentation

## 2019-11-03 DIAGNOSIS — Z880 Allergy status to penicillin: Secondary | ICD-10-CM | POA: Insufficient documentation

## 2019-11-03 HISTORY — PX: EMBOLIZATION: CATH118239

## 2019-11-03 LAB — CREATININE, SERUM
Creatinine, Ser: 0.8 mg/dL (ref 0.44–1.00)
GFR, Estimated: >60 mL/min (ref >60)

## 2019-11-03 LAB — PREGNANCY, URINE: Preg Test, Ur: NEGATIVE

## 2019-11-03 LAB — BUN: BUN: 7 mg/dL (ref 6–20)

## 2019-11-03 SURGERY — EMBOLIZATION
Anesthesia: Moderate Sedation

## 2019-11-03 MED ORDER — CLINDAMYCIN PHOSPHATE 300 MG/50ML IV SOLN
INTRAVENOUS | Status: AC
  Administered 2019-11-03: 300 mg via INTRAVENOUS
  Filled 2019-11-03: qty 50

## 2019-11-03 MED ORDER — DIPHENHYDRAMINE HCL 50 MG/ML IJ SOLN: 50.0000 mg | Freq: Once | INTRAMUSCULAR | Status: DC | PRN

## 2019-11-03 MED ORDER — OXYCODONE-ACETAMINOPHEN 7.5-325 MG PO TABS
1.0000 | ORAL_TABLET | ORAL | 0 refills | Status: DC | PRN
Start: 2019-11-03 — End: 2020-02-01

## 2019-11-03 MED ORDER — HEPARIN SODIUM (PORCINE) 1000 UNIT/ML IJ SOLN
INTRAMUSCULAR | Status: DC | PRN
  Administered 2019-11-03: 3000 [IU] via INTRAVENOUS

## 2019-11-03 MED ORDER — IODIXANOL 320 MG/ML IV SOLN
INTRAVENOUS | Status: DC | PRN
  Administered 2019-11-03: 50 mL via INTRA_ARTERIAL

## 2019-11-03 MED ORDER — HYDROMORPHONE HCL 1 MG/ML IJ SOLN
INTRAMUSCULAR | Status: AC
  Administered 2019-11-03: 0.5 mg
  Filled 2019-11-03: qty 0.5

## 2019-11-03 MED ORDER — MIDAZOLAM HCL 2 MG/ML PO SYRP: 8.0000 mg | ORAL_SOLUTION | Freq: Once | ORAL | Status: DC | PRN

## 2019-11-03 MED ORDER — FENTANYL CITRATE (PF) 100 MCG/2ML IJ SOLN
INTRAMUSCULAR | Status: AC
  Filled 2019-11-03: qty 2

## 2019-11-03 MED ORDER — METHYLPREDNISOLONE SODIUM SUCC 125 MG IJ SOLR: 125.0000 mg | Freq: Once | INTRAMUSCULAR | Status: DC | PRN

## 2019-11-03 MED ORDER — ONDANSETRON HCL 4 MG/2ML IJ SOLN: 4.0000 mg | Freq: Four times a day (QID) | INTRAMUSCULAR | Status: DC | PRN

## 2019-11-03 MED ORDER — CLINDAMYCIN PHOSPHATE 300 MG/50ML IV SOLN: 300.0000 mg | Freq: Once | INTRAVENOUS | Status: AC

## 2019-11-03 MED ORDER — FENTANYL CITRATE (PF) 100 MCG/2ML IJ SOLN: 12.5000 ug | Freq: Once | INTRAMUSCULAR | Status: DC | PRN

## 2019-11-03 MED ORDER — FAMOTIDINE 20 MG PO TABS: 40.0000 mg | ORAL_TABLET | Freq: Once | ORAL | Status: DC | PRN

## 2019-11-03 MED ORDER — MIDAZOLAM HCL 5 MG/5ML IJ SOLN
INTRAMUSCULAR | Status: AC
  Filled 2019-11-03: qty 5

## 2019-11-03 MED ORDER — MIDAZOLAM HCL 2 MG/2ML IJ SOLN
INTRAMUSCULAR | Status: DC | PRN
  Administered 2019-11-03: 2 mg via INTRAVENOUS
  Administered 2019-11-03: 1 mg via INTRAVENOUS
  Administered 2019-11-03: 2 mg via INTRAVENOUS

## 2019-11-03 MED ORDER — FENTANYL CITRATE (PF) 100 MCG/2ML IJ SOLN
INTRAMUSCULAR | Status: DC | PRN
  Administered 2019-11-03 (×2): 50 ug via INTRAVENOUS
  Administered 2019-11-03: 25 ug via INTRAVENOUS

## 2019-11-03 MED ORDER — KETOROLAC TROMETHAMINE 30 MG/ML IJ SOLN
30.0000 mg | Freq: Once | INTRAMUSCULAR | Status: AC
  Administered 2019-11-03: 30 mg via INTRAVENOUS
  Filled 2019-11-03: qty 1

## 2019-11-03 MED ORDER — KETOROLAC TROMETHAMINE 60 MG/2ML IM SOLN
INTRAMUSCULAR | Status: AC
  Filled 2019-11-03: qty 2

## 2019-11-03 MED ORDER — HEPARIN SODIUM (PORCINE) 1000 UNIT/ML IJ SOLN
INTRAMUSCULAR | Status: AC
  Filled 2019-11-03: qty 1

## 2019-11-03 MED ORDER — SODIUM CHLORIDE 0.9 % IV SOLN: INTRAVENOUS | Status: DC

## 2019-11-03 SURGICAL SUPPLY — 18 items
BLOCK BEAD 700-900 (Vascular Products) ×3 IMPLANT
CATH BEACON 5 .035 65 KMP TIP (CATHETERS) ×3 IMPLANT
CATH MICROCATH PRGRT 2.8F 110 (CATHETERS) ×1 IMPLANT
CATH PIG 70CM (CATHETERS) ×3 IMPLANT
CATH VS15FR (CATHETERS) ×3 IMPLANT
COIL 400 COMPLEX SOFT 4X15CM (Vascular Products) ×9 IMPLANT
COIL 400 COMPLEX SOFT 4X6CM (Vascular Products) ×3 IMPLANT
COVER PROBE U/S 5X48 (MISCELLANEOUS) ×3 IMPLANT
DEVICE STARCLOSE SE CLOSURE (Vascular Products) ×3 IMPLANT
DEVICE TORQUE .025-.038 (MISCELLANEOUS) ×3 IMPLANT
GLIDEWIRE STIFF .35X180X3 HYDR (WIRE) ×3 IMPLANT
GUIDEWIRE ANGLED .035 180CM (WIRE) ×3 IMPLANT
HANDLE DETACHMENT COIL (MISCELLANEOUS) ×3 IMPLANT
MICROCATH PROGREAT 2.8F 110 CM (CATHETERS) ×3
PACK ANGIOGRAPHY (CUSTOM PROCEDURE TRAY) ×3 IMPLANT
SHEATH BRITE TIP 5FRX11 (SHEATH) ×3 IMPLANT
TUBING CONTRAST HIGH PRESS 72 (TUBING) ×6 IMPLANT
WIRE J 3MM .035X145CM (WIRE) ×3 IMPLANT

## 2019-11-03 NOTE — Progress Notes (Signed)
Patient arrived to unit, aox4, no c/o's.  Hcg obtained:  Neg. Lungs CTA,. Verbalizes understanding of procedure, post op care and f/u as directed.

## 2019-11-03 NOTE — Interval H&P Note (Signed)
History and Physical Interval Note:  11/03/2019 12:08 PM  Jaclyn Kelly  has presented today for surgery, with the diagnosis of Uterine artery embolization   Functional uterine bleeding Covid  Oct 19.  The various methods of treatment have been discussed with the patient and family. After consideration of risks, benefits and other options for treatment, the patient has consented to  Procedure(s): EMBOLIZATION (N/A) as a surgical intervention.  The patient's history has been reviewed, patient examined, no change in status, stable for surgery.  I have reviewed the patient's chart and labs.  Questions were answered to the patient's satisfaction.     Festus Barren

## 2019-11-03 NOTE — Progress Notes (Signed)
Dr. Wyn Quaker spoke to patient and boyfriend regarding procedure, folliow up.  Patient wants to go home, Dr. Wyn Quaker will order pain medications, pt and boyfriend verbalize undertanding.

## 2019-11-03 NOTE — Op Note (Signed)
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 Ultrasound guidance for vascular access right femoral artery 2. Catheter placement into left uterine artery and right uterine artery from right femoral approach 3. Aortogram and selective pelvic arteriograms including selective imaging of both uterine arteries 4. Micro-bead embolization with 5 cc of 700-900  polyvinyl alcohol beads to the left uterine artery and then two Ruby coil placements both vein 4 mm diameter by 15 cm in length 5. Micro-bead embolization with 5 cc of 700-900  polyvinyl alcohol beads to the right uterine artery and then two Ruby coil placements with a 4 mm diameter by 6 cm length and a 4 mm diameter by 15 cm length  6. StarClose closure device right femoral artery  EBL: 5 cc  Contrast: 50 cc  Fluoro Time: 15.9 min  Moderate Conscious Sedation time: approximately 60 minutes using 5 mg of Versed and 125 mcg of Fentanyl  Indications: Patient is a 19 y.o. female with dysfunctional uterine bleeding for about 6 months and an MRI showing a vascular malformation in her uterus. She is interested in having uterine artery embolization to treat her dysfunctional uterine bleeding. Risks and benefits are discussed and informed consent is obtained  Procedure: The patient was identified and appropriate procedural time out was performed. The patient was then placed supine on the table and prepped and draped in the usual sterile fashion. Moderate conscious sedation was administered throughout the procedure with a face to face encounter with the patient throughout and with my  supervision of the RN administering medicines and monitoring the patients vital signs and mental status throughout from the start of the procedure until the patient was taken to the recovery room.  Ultrasound was used to evaluate the right common femoral artery. It was patent . A digital ultrasound image was acquired. A Seldinger needle was used to access the right common femoral artery under direct ultrasound guidance and a permanent image was performed. A 0.035 J wire was advanced without resistance and a 5Fr sheath was placed.3000 units of heparin were given. Pigtail catheter was placed into the aorta and an AP aortogram was performed. This demonstrated normal renal arteries and normal aorta and iliac segments without significant stenosis. On the initial imaging, significant flow through larger than average uterine arteries was identified and on the initial aortogram. I then crossed the aortic bifurcation and advanced to the left iliac bifurcation. Using a Kumpe catheter, I was able to cannulate the hypogastric artery on the left. Selective imaging was performed of the hypogastric artery which demonstrated the uterine artery to be the third branch off of the hypogastric artery and to be a fairly small lateral going branch before it goes back medially. Using a progreat microcatheter catheter, I was able to cannulate the left uterine artery and advance beyond its initial branches well into the left uterine artery.  Selective imaging was performed. I then performed left uterine artery embolization using approximately 5 cc of 700-900  polyvinyl alcohol beads into the left uterine artery for selective embolization until there was minimal forward flow into the uterine artery going to the uterus.  I then placed 2 Ruby coils at this location both being 4 mm in diameter by 15 cm in length.  The microcatheter was then removed. I then turned my attention to the  right uterine artery. I was able to cannulate the right  hypogastric artery with the Kumpe catheter from the right femoral approach and using a Glidewire advanced this into the main right hypogastric artery. Selective imaging was performed in the right hypogastric artery which demonstrated 2 large primary branches and then the uterine artery came off as the medial of the 2 primary branches and was the second branch.  It was medial going and then very tortuous.  I was able to advance the pro-great microcatheter into the uterine artery and down beyond its primary branches near where it entered the uterus itself. Selective imaging was performed. Approximately 5 cc of the 700 to 900  polyvinyl alcohol beads were then instilled into the right uterine artery for selective embolization until there was minimal forward flow into the uterus from the right uterine artery.  I then placed 2 Ruby coils in the right uterine artery.  The first was 4 mm in diameter by 6 cm in length and the second was 4 mm in diameter by 15 cm in length.  There was then no forward flow through the uterine artery.  The diagnostic catheter was then removed. I elected to terminate the procedure. The sheath was removed and StarClose closure device was deployed in the right femoral artery with excellent hemostatic result. The patient was taken to the recovery room in stable condition having tolerated the procedure well.      Disposition: Patient was taken to the recovery room in stable condition having tolerated the procedure well.  Complications: None

## 2019-11-04 ENCOUNTER — Encounter: Payer: Self-pay | Admitting: Vascular Surgery

## 2019-11-04 NOTE — Telephone Encounter (Signed)
Can you call the patient and see if she's referring to a pre-authorization? I don't see one in my system and I'm not sure if one has been faxed

## 2019-11-07 ENCOUNTER — Encounter (INDEPENDENT_AMBULATORY_CARE_PROVIDER_SITE_OTHER): Payer: Self-pay

## 2019-12-06 ENCOUNTER — Encounter: Payer: Self-pay | Admitting: Obstetrics and Gynecology

## 2019-12-06 ENCOUNTER — Other Ambulatory Visit: Payer: Self-pay

## 2019-12-06 ENCOUNTER — Ambulatory Visit (INDEPENDENT_AMBULATORY_CARE_PROVIDER_SITE_OTHER): Payer: Medicaid Other | Admitting: Obstetrics and Gynecology

## 2019-12-06 ENCOUNTER — Encounter: Payer: Medicaid Other | Admitting: Obstetrics and Gynecology

## 2019-12-06 VITALS — BP 135/85 | HR 81 | Ht 61.0 in | Wt 126.8 lb

## 2019-12-06 DIAGNOSIS — R631 Polydipsia: Secondary | ICD-10-CM

## 2019-12-06 DIAGNOSIS — Z30011 Encounter for initial prescription of contraceptive pills: Secondary | ICD-10-CM | POA: Diagnosis not present

## 2019-12-06 DIAGNOSIS — Z8742 Personal history of other diseases of the female genital tract: Secondary | ICD-10-CM | POA: Diagnosis not present

## 2019-12-06 DIAGNOSIS — Z3009 Encounter for other general counseling and advice on contraception: Secondary | ICD-10-CM

## 2019-12-06 MED ORDER — LO LOESTRIN FE 1 MG-10 MCG / 10 MCG PO TABS: 1.0000 | ORAL_TABLET | Freq: Every day | ORAL | 0 refills | Status: DC

## 2019-12-06 NOTE — Progress Notes (Signed)
HPI:      Jaclyn Kelly is a 19 y.o. [id number] who LMP was Patient's last menstrual period was 05/16/2019 (lmp unknown).  Subjective:   She presents today after an embolization procedure in the myometrium which was causing menorrhagia. Patient began bleeding earlier this week and she thinks it may be her first menstrual period. She has also noticed an increase in thirst where she thinks she is drinking all the time.  States she has a very strong family history for diabetes. Patient weaning from Suboxone and doing very well. Antidepressant medication going well.  Patient in good spirits and mood. Desires birth control.  Would like to begin OCPs.    Hx: The following portions of the patient's history were reviewed and updated as appropriate:             She  has a past medical history of Anxiety, Depression, GERD (gastroesophageal reflux disease), Irritable bowel syndrome (IBS) (since childhood), Mental disorder, Pinched nerve (ongoing), and Thoracic outlet syndrome. She does not have any pertinent problems on file. She  has a past surgical history that includes First rib removal; Tonsillectomy; Cesarean section (N/A, 02/09/2017); Cesarean section (N/A, 11/22/2018); Dilation and evacuation (N/A, 08/10/2019); and EMBOLIZATION (N/A, 11/03/2019). Her family history includes Healthy in her mother; Heart failure in her father; Hypertension in her father; Stroke in her father. She  reports that she has never smoked. She has never used smokeless tobacco. She reports that she does not drink alcohol and does not use drugs. She has a current medication list which includes the following prescription(s): desvenlafaxine, suboxone, ibuprofen, levonorgestrel-ethinyl estradiol, and oxycodone-acetaminophen. She is allergic to vicodin [hydrocodone-acetaminophen], amoxicillin, and tramadol.       Review of Systems:  Review of Systems  Constitutional: Denied constitutional symptoms, night sweats, recent  illness, fatigue, fever, insomnia and weight loss.  Eyes: Denied eye symptoms, eye pain, photophobia, vision change and visual disturbance.  Ears/Nose/Throat/Neck: Denied ear, nose, throat or neck symptoms, hearing loss, nasal discharge, sinus congestion and sore throat.  Cardiovascular: Denied cardiovascular symptoms, arrhythmia, chest pain/pressure, edema, exercise intolerance, orthopnea and palpitations.  Respiratory: Denied pulmonary symptoms, asthma, pleuritic pain, productive sputum, cough, dyspnea and wheezing.  Gastrointestinal: Denied, gastro-esophageal reflux, melena, nausea and vomiting.  Genitourinary: Denied genitourinary symptoms including symptomatic vaginal discharge, pelvic relaxation issues, and urinary complaints.  Musculoskeletal: Denied musculoskeletal symptoms, stiffness, swelling, muscle weakness and myalgia.  Dermatologic: Denied dermatology symptoms, rash and scar.  Neurologic: Denied neurology symptoms, dizziness, headache, neck pain and syncope.  Psychiatric: Denied psychiatric symptoms, anxiety and depression.  Endocrine: Denied endocrine symptoms including hot flashes and night sweats.   Meds:   Current Outpatient Medications on File Prior to Visit  Medication Sig Dispense Refill  . desvenlafaxine (PRISTIQ) 100 MG 24 hr tablet Take 100 mg by mouth every morning.    . SUBOXONE 8-2 MG FILM Place 1 Film under the tongue daily.    Marland Kitchen ibuprofen (ADVIL) 800 MG tablet Take 1 tablet (800 mg total) by mouth every 8 (eight) hours as needed. (Patient not taking: Reported on 12/06/2019) 30 tablet 0  . Levonorgestrel-Ethinyl Estradiol (AMETHIA) 0.15-0.03 &0.01 MG tablet Take 1 tablet by mouth at bedtime. (Patient not taking: Reported on 12/06/2019) 84 tablet 1  . oxyCODONE-acetaminophen (PERCOCET) 7.5-325 MG tablet Take 1 tablet by mouth every 4 (four) hours as needed for severe pain. (Patient not taking: Reported on 12/06/2019) 30 tablet 0   No current facility-administered  medications on file prior to visit.  The pregnancy intention screening data noted above was reviewed. Potential methods of contraception were discussed. The patient elected to proceed with Oral Contraceptive.     Objective:     Vitals:   12/06/19 1420  BP: 135/85  Pulse: 81   Filed Weights   12/06/19 1420  Weight: 126 lb 12.8 oz (57.5 kg)                Assessment:    [id number] Patient Active Problem List   Diagnosis Date Noted  . Dysfunctional uterine bleeding 10/28/2019  . Uterine mass 10/28/2019  . Cesarean delivery delivered 11/22/2018  . History of cesarean delivery 06/16/2018  . Bipolar affective disorder in remission (HCC) 08/19/2016  . Severe episode of recurrent major depressive disorder, without psychotic features (HCC)   . Insomnia 06/27/2015  . MDD (major depressive disorder) 06/25/2015  . Moderate episode of recurrent major depressive disorder (HCC) 04/23/2015  . History of trauma 04/11/2015  . Miscarriage 04/11/2015  . Polysubstance dependence in early, early partial, sustained full, or sustained partial remission (HCC) 04/11/2015  . Recurrent major depressive disorder, in full remission (HCC) 04/11/2015  . OSA (obstructive sleep apnea) 09/19/2014  . Anxiety 09/18/2014  . Chronic pain of right ankle 09/18/2014  . 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 Constipation 09/15/2014  . Chronic tonsillitis 12/02/2013  . Cervical rib 02/17/2012  . Hereditary and idiopathic peripheral neuropathy 02/17/2012     1. H/O menorrhagia   2. Increased thirst   3. Birth control counseling   4. Initiation of OCP (BCP)     Patient seems to be doing well after embolization procedure.   Plan:            1.  CBC  2.  Hemoglobin A1c to rule out diabetes  3.  Begin OCPs.  OCPs The risks /benefits of OCPs have been explained to the patient in detail.  Product literature has been given to her where appropriate.  I have instructed her  in the use of OCPs.  I have explained to the patient that OCPs are not as effective for birth control during the first month of use, and that another form of contraception should be used during this time.  Both first-day start and Sunday start have been explained.  The risks and benefits of each was discussed.  She has been made aware of  the fact that in rare circumstances, other medications may affect the efficacy of OCPs.  I have answered all of her questions, and I believe that she has an understanding of the effectiveness and use of OCPs.  Orders Orders Placed This Encounter  Procedures  . CBC  . Hemoglobin A1c    No orders of the defined types were placed in this encounter.     F/U  Return in about 3 months (around 03/07/2020). I spent 22 minutes involved in the care of this patient preparing to see the patient by obtaining and reviewing her medical history (including labs, imaging tests and prior procedures), documenting clinical information in the electronic health record (EHR), counseling and coordinating care plans, writing and sending prescriptions, ordering tests or procedures and directly communicating with the patient by discussing pertinent items from her history and physical exam as well as detailing my assessment and plan as noted above so that she has an informed understanding.  All of her questions were answered.  Elonda Husky, M.D. 12/06/2019 2:42 PM

## 2019-12-07 ENCOUNTER — Encounter: Payer: Medicaid Other | Admitting: Obstetrics and Gynecology

## 2019-12-07 LAB — CBC
Hematocrit: 37.6 % (ref 34.0–46.6)
Hemoglobin: 12.3 g/dL (ref 11.1–15.9)
MCH: 25.5 pg — ABNORMAL LOW (ref 26.6–33.0)
MCHC: 32.7 g/dL (ref 31.5–35.7)
MCV: 78 fL — ABNORMAL LOW (ref 79–97)
Platelets: 218 10*3/uL (ref 150–450)
RBC: 4.83 x10E6/uL (ref 3.77–5.28)
RDW: 13.1 % (ref 11.7–15.4)
WBC: 6.2 10*3/uL (ref 3.4–10.8)

## 2019-12-07 LAB — HEMOGLOBIN A1C
Est. average glucose Bld gHb Est-mCnc: 111 mg/dL
Hgb A1c MFr Bld: 5.5 % (ref 4.8–5.6)

## 2019-12-16 ENCOUNTER — Other Ambulatory Visit: Payer: Self-pay | Admitting: Surgical

## 2019-12-16 MED ORDER — FERROUS SULFATE 324 MG PO TBEC: 324.0000 mg | DELAYED_RELEASE_TABLET | Freq: Two times a day (BID) | ORAL | 1 refills | Status: DC

## 2020-01-20 ENCOUNTER — Encounter: Payer: Medicaid Other | Admitting: Obstetrics and Gynecology

## 2020-01-24 ENCOUNTER — Encounter: Payer: Self-pay | Admitting: Obstetrics and Gynecology

## 2020-02-01 ENCOUNTER — Other Ambulatory Visit: Payer: Self-pay

## 2020-02-01 ENCOUNTER — Ambulatory Visit (INDEPENDENT_AMBULATORY_CARE_PROVIDER_SITE_OTHER): Payer: Medicaid Other | Admitting: Obstetrics and Gynecology

## 2020-02-01 ENCOUNTER — Encounter: Payer: Self-pay | Admitting: Obstetrics and Gynecology

## 2020-02-01 VITALS — BP 133/91 | HR 105 | Ht 61.0 in | Wt 122.4 lb

## 2020-02-01 DIAGNOSIS — N921 Excessive and frequent menstruation with irregular cycle: Secondary | ICD-10-CM

## 2020-02-01 DIAGNOSIS — R102 Pelvic and perineal pain unspecified side: Secondary | ICD-10-CM

## 2020-02-01 LAB — POCT URINE PREGNANCY: Preg Test, Ur: NEGATIVE

## 2020-02-01 MED ORDER — IBUPROFEN 800 MG PO TABS: 800.0000 mg | ORAL_TABLET | Freq: Three times a day (TID) | ORAL | 0 refills | Status: DC | PRN

## 2020-02-01 NOTE — Progress Notes (Signed)
HPI:      [name] is a 20 y.o. [id number] who LMP was Patient's last menstrual period was 01/17/2020.  Subjective:   She presents today stating that she feels like she has signs and symptoms of pregnancy.  She is taking OCPs as directed but her cycles remain irregular and she bleeds when on the active pills.  She does complain of intermittent pelvic pain more on her right side than on her left.  She continues to have pelvic pain with intercourse. She also feels as if the OCPs are not working for her to give her cycle control.  She says that she does not feel she gave the IUD an adequate trial last time and she would like the IUD again and is willing to give it more time to induce reduced menses or amenorrhea.  She has stated that she is not ready for another child at this time.  Of significant note, patient previously had a vascular malformation which was embolized.    Hx: The following portions of the patient's history were reviewed and updated as appropriate:             She  has a past medical history of Anxiety, Depression, GERD (gastroesophageal reflux disease), Irritable bowel syndrome (IBS) (since childhood), Mental disorder, Pinched nerve (ongoing), and Thoracic outlet syndrome. She does not have any pertinent problems on file. She  has a past surgical history that includes First rib removal; Tonsillectomy; Cesarean section (N/A, 02/09/2017); Cesarean section (N/A, 11/22/2018); Dilation and evacuation (N/A, 08/10/2019); and EMBOLIZATION (N/A, 11/03/2019). Her family history includes Healthy in her mother; Heart failure in her father; Hypertension in her father; Stroke in her father. She  reports that she has never smoked. She has never used smokeless tobacco. She reports that she does not drink alcohol and does not use drugs. She has a current medication list which includes the following prescription(s): desvenlafaxine, ferrous sulfate, ibuprofen, lo loestrin fe, and suboxone. She  is allergic to vicodin [hydrocodone-acetaminophen], amoxicillin, and tramadol.       Review of Systems:  Review of Systems  Constitutional: Denied constitutional symptoms, night sweats, recent illness, fatigue, fever, insomnia and weight loss.  Eyes: Denied eye symptoms, eye pain, photophobia, vision change and visual disturbance.  Ears/Nose/Throat/Neck: Denied ear, nose, throat or neck symptoms, hearing loss, nasal discharge, sinus congestion and sore throat.  Cardiovascular: Denied cardiovascular symptoms, arrhythmia, chest pain/pressure, edema, exercise intolerance, orthopnea and palpitations.  Respiratory: Denied pulmonary symptoms, asthma, pleuritic pain, productive sputum, cough, dyspnea and wheezing.  Gastrointestinal: Denied, gastro-esophageal reflux, melena, nausea and vomiting.  Genitourinary: See HPI for additional information.  Musculoskeletal: Denied musculoskeletal symptoms, stiffness, swelling, muscle weakness and myalgia.  Dermatologic: Denied dermatology symptoms, rash and scar.  Neurologic: Denied neurology symptoms, dizziness, headache, neck pain and syncope.  Psychiatric: Denied psychiatric symptoms, anxiety and depression.  Endocrine: Denied endocrine symptoms including hot flashes and night sweats.   Meds:   Current Outpatient Medications on File Prior to Visit  Medication Sig Dispense Refill  . desvenlafaxine (PRISTIQ) 100 MG 24 hr tablet Take 100 mg by mouth every morning.    . ferrous sulfate 324 MG TBEC Take 1 tablet (324 mg total) by mouth 2 (two) times daily with a meal. 30 tablet 1  . Norethindrone-Ethinyl Estradiol-Fe Biphas (LO LOESTRIN FE) 1 MG-10 MCG / 10 MCG tablet Take 1 tablet by mouth at bedtime. 84 tablet 0  . SUBOXONE 8-2 MG FILM Place 1 Film under the tongue daily.  No current facility-administered medications on file prior to visit.       The pregnancy intention screening data noted above was reviewed. Potential methods of contraception were  discussed. The patient elected to proceed with IUD or IUS.     Objective:     Vitals:   02/01/20 1335  BP: (!) 133/91  Pulse: (!) 105   Filed Weights   02/01/20 1335  Weight: 122 lb 6.4 oz (55.5 kg)              Urinary pregnancy test negative  Assessment:    [id number] Patient Active Problem List   Diagnosis Date Noted  . Dysfunctional uterine bleeding 10/28/2019  . Uterine mass 10/28/2019  . Cesarean delivery delivered 11/22/2018  . History of cesarean delivery 06/16/2018  . Bipolar affective disorder in remission (HCC) 08/19/2016  . Severe episode of recurrent major depressive disorder, without psychotic features (HCC)   . Insomnia 06/27/2015  . MDD (major depressive disorder) 06/25/2015  . Moderate episode of recurrent major depressive disorder (HCC) 04/23/2015  . History of trauma 04/11/2015  . Miscarriage 04/11/2015  . Polysubstance dependence in early, early partial, sustained full, or sustained partial remission (HCC) 04/11/2015  . Recurrent major depressive disorder, in full remission (HCC) 04/11/2015  . OSA (obstructive sleep apnea) 09/19/2014  . Anxiety 09/18/2014  . Chronic pain of right ankle 09/18/2014  . 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 Constipation 09/15/2014  . Chronic tonsillitis 12/02/2013  . Cervical rib 02/17/2012  . Hereditary and idiopathic peripheral neuropathy 02/17/2012     1. Pelvic pain in female   2. Breakthrough bleeding on OCPs     Patient continues to have breakthrough bleeding on OCPs and intermittent pelvic pain.  She says she is taking the pills correctly and does not believe they are giving her the cycle control she needs.  She is happy not to be pregnant at this time.   Plan:            1. ultrasound to rule out any pelvic abnormality  2. patient to call with her next menstrual period for IUD insertion. Orders Orders Placed This Encounter  Procedures  . US PELVIS (TRANSABDOMINAL ONLY)  .  US PELVIS TRANSVAGINAL NON-OB (TV ONLY)     Meds ordered this encounter  Medications  . ibuprofen (ADVIL) 800 MG tablet    Sig: Take 1 tablet (800 mg total) by mouth every 8 (eight) hours as needed.    Dispense:  60 tablet    Refill:  0      F/U  Return for She is to call at the start of next menses. I spent 23 minutes involved in the care of this patient preparing to see the patient by obtaining and reviewing her medical history (including labs, imaging tests and prior procedures), documenting clinical information in the electronic health record (EHR), counseling and coordinating care plans, writing and sending prescriptions, ordering tests or procedures and directly communicating with the patient by discussing pertinent items from her history and physical exam as well as detailing my assessment and plan as noted above so that she has an informed understanding.  All of her questions were answered.  Elonda Husky, M.D. 02/01/2020 2:08 PM

## 2020-02-06 ENCOUNTER — Telehealth: Payer: Self-pay

## 2020-02-06 NOTE — Telephone Encounter (Signed)
Patient called in stating that she is having some abdominal pains that resemble the same pains she had when she had an ectopic pregnancy. I asked the patient if she had taken a pregnancy test and she stated that she had not taken one. Patient states that she is having to pee constantly and her BP is high and this has been going on for about 2 weeks. Asked patient what she would like me to do for her and she requested I send a message back to her providers nurse and have a call back. Informed patient that Myriam Jacobson was out today and to allow her 24-72 hours to get back in touch with her. Patient verbalized understanding. Could you please advise?

## 2020-02-08 ENCOUNTER — Other Ambulatory Visit: Payer: Self-pay

## 2020-02-08 ENCOUNTER — Other Ambulatory Visit: Payer: Self-pay | Admitting: Obstetrics and Gynecology

## 2020-02-08 ENCOUNTER — Ambulatory Visit (INDEPENDENT_AMBULATORY_CARE_PROVIDER_SITE_OTHER): Payer: Medicaid Other

## 2020-02-08 DIAGNOSIS — N921 Excessive and frequent menstruation with irregular cycle: Secondary | ICD-10-CM

## 2020-02-08 DIAGNOSIS — R102 Pelvic and perineal pain: Secondary | ICD-10-CM | POA: Diagnosis not present

## 2020-02-08 NOTE — Telephone Encounter (Signed)
Tried to call patient and did not get an answer.

## 2020-02-08 NOTE — Addendum Note (Signed)
Addended by: Dorian Pod on: 02/08/2020 04:25 PM   Modules accepted: Orders

## 2020-02-09 ENCOUNTER — Other Ambulatory Visit: Payer: Medicaid Other

## 2020-02-09 NOTE — Telephone Encounter (Signed)
Sent patient a my chart message. Waiting for Dr. Logan Bores to review [place].

## 2020-02-14 ENCOUNTER — Ambulatory Visit (INDEPENDENT_AMBULATORY_CARE_PROVIDER_SITE_OTHER): Payer: Medicaid Other | Admitting: Obstetrics and Gynecology

## 2020-02-14 ENCOUNTER — Other Ambulatory Visit: Payer: Self-pay

## 2020-02-14 ENCOUNTER — Encounter: Payer: Self-pay | Admitting: Obstetrics and Gynecology

## 2020-02-14 ENCOUNTER — Encounter: Payer: Medicaid Other | Admitting: Obstetrics and Gynecology

## 2020-02-14 VITALS — BP 111/75 | HR 98 | Ht 61.0 in | Wt 125.1 lb

## 2020-02-14 DIAGNOSIS — N921 Excessive and frequent menstruation with irregular cycle: Secondary | ICD-10-CM | POA: Diagnosis not present

## 2020-02-14 DIAGNOSIS — R102 Pelvic and perineal pain: Secondary | ICD-10-CM | POA: Diagnosis not present

## 2020-02-14 LAB — POCT URINALYSIS DIPSTICK OB
Bilirubin, UA: NEGATIVE
Blood, UA: NEGATIVE
Glucose, UA: NEGATIVE
Ketones, UA: NEGATIVE
Leukocytes, UA: NEGATIVE
Nitrite, UA: NEGATIVE
POC,PROTEIN,UA: NEGATIVE
Spec Grav, UA: 1.01 (ref 1.010–1.025)
Urobilinogen, UA: 0.2 E.U./dL
pH, UA: 7 (ref 5.0–8.0)

## 2020-02-14 NOTE — Progress Notes (Signed)
HPI:      [name] is a 20 y.o. [id number] who LMP was Patient's last menstrual period was 01/17/2020.  Subjective:   She presents today saying that she continues to have irregular bleeding on OCPs and occasional pelvic pain. Previous ultrasound showed intrauterine cornual mass. (Urinary beta-hCG at that time was negative) I have spoken with vascular regarding embolization procedure and they say that this mass is not a result of that procedure. Patient desires IUD for birth control and bleeding control.    Hx: The following portions of the patient's history were reviewed and updated as appropriate:             She  has a past medical history of Anxiety, Depression, GERD (gastroesophageal reflux disease), Irritable bowel syndrome (IBS) (since childhood), Mental disorder, Pinched nerve (ongoing), and Thoracic outlet syndrome. She does not have any pertinent problems on file. She  has a past surgical history that includes First rib removal; Tonsillectomy; Cesarean section (N/A, 02/09/2017); Cesarean section (N/A, 11/22/2018); Dilation and evacuation (N/A, 08/10/2019); and EMBOLIZATION (N/A, 11/03/2019). Her family history includes Healthy in her mother; Heart failure in her father; Hypertension in her father; Stroke in her father. She  reports that she has never smoked. She has never used smokeless tobacco. She reports that she does not drink alcohol and does not use drugs. She has a current medication list which includes the following prescription(s): desvenlafaxine, ferrous sulfate, ibuprofen, suboxone, and lo loestrin fe. She is allergic to vicodin [hydrocodone-acetaminophen], amoxicillin, and tramadol.       Review of Systems:  Review of Systems  Constitutional: Denied constitutional symptoms, night sweats, recent illness, fatigue, fever, insomnia and weight loss.  Eyes: Denied eye symptoms, eye pain, photophobia, vision change and visual disturbance.  Ears/Nose/Throat/Neck: Denied  ear, nose, throat or neck symptoms, hearing loss, nasal discharge, sinus congestion and sore throat.  Cardiovascular: Denied cardiovascular symptoms, arrhythmia, chest pain/pressure, edema, exercise intolerance, orthopnea and palpitations.  Respiratory: Denied pulmonary symptoms, asthma, pleuritic pain, productive sputum, cough, dyspnea and wheezing.  Gastrointestinal: Denied, gastro-esophageal reflux, melena, nausea and vomiting.  Genitourinary: See HPI for additional information.  Musculoskeletal: Denied musculoskeletal symptoms, stiffness, swelling, muscle weakness and myalgia.  Dermatologic: Denied dermatology symptoms, rash and scar.  Neurologic: Denied neurology symptoms, dizziness, headache, neck pain and syncope.  Psychiatric: Denied psychiatric symptoms, anxiety and depression.  Endocrine: Denied endocrine symptoms including hot flashes and night sweats.   Meds:   Current Outpatient Medications on File Prior to Visit  Medication Sig Dispense Refill  . desvenlafaxine (PRISTIQ) 100 MG 24 hr tablet Take 100 mg by mouth every morning.    . ferrous sulfate 324 MG TBEC Take 1 tablet (324 mg total) by mouth 2 (two) times daily with a meal. 30 tablet 1  . ibuprofen (ADVIL) 800 MG tablet Take 1 tablet (800 mg total) by mouth every 8 (eight) hours as needed. 60 tablet 0  . SUBOXONE 8-2 MG FILM Place 1 Film under the tongue daily.    . Norethindrone-Ethinyl Estradiol-Fe Biphas (LO LOESTRIN FE) 1 MG-10 MCG / 10 MCG tablet Take 1 tablet by mouth at bedtime. (Patient not taking: Reported on 02/14/2020) 84 tablet 0   No current facility-administered medications on file prior to visit.       The pregnancy intention screening data noted above was reviewed. Potential methods of contraception were discussed. The patient elected to proceed with Oral Contraceptive.     Objective:     Vitals:   02/14/20 8250  BP: 111/75  Pulse: 98   Filed Weights   02/14/20 0852  Weight: 125 lb 1.6 oz (56.7  kg)              [place]:  There is a complex mass in the right cornua area. This may represent a degenerating fibroid vs. Other. It measures 14.7 x 9.0 x 9.4 mm. There is blood flow seen around the periphery.    Assessment:    [id number] Patient Active Problem List   Diagnosis Date Noted  . Dysfunctional uterine bleeding 10/28/2019  . Uterine mass 10/28/2019  . Cesarean delivery delivered 11/22/2018  . History of cesarean delivery 06/16/2018  . Bipolar affective disorder in remission (HCC) 08/19/2016  . Severe episode of recurrent major depressive disorder, without psychotic features (HCC)   . Insomnia 06/27/2015  . MDD (major depressive disorder) 06/25/2015  . Moderate episode of recurrent major depressive disorder (HCC) 04/23/2015  . History of trauma 04/11/2015  . Miscarriage 04/11/2015  . Polysubstance dependence in early, early partial, sustained full, or sustained partial remission (HCC) 04/11/2015  . Recurrent major depressive disorder, in full remission (HCC) 04/11/2015  . OSA (obstructive sleep apnea) 09/19/2014  . Anxiety 09/18/2014  . Chronic pain of right ankle 09/18/2014  . 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 Constipation 09/15/2014  . Chronic tonsillitis 12/02/2013  . Cervical rib 02/17/2012  . Hereditary and idiopathic peripheral neuropathy 02/17/2012     1. Pelvic pain in female   2. Breakthrough bleeding on birth control pills     Patient has an unknown cystic lesion in the cornual region of the uterus -likely the source of her irregular bleeding.   Plan:            1.  Serum beta hCG  2.  MRI -if hCG negative  3.  Hold off placing IUD until diagnosis/treatment   4.  I have discussed all these findings and the rationale for the work-up in detail with the patient.  All of her questions were answered. Orders Orders Placed This Encounter  Procedures  . Beta hCG quant (ref lab)  . POC Urinalysis Dipstick OB    No orders of  the defined types were placed in this encounter.     F/U  Return for We will contact her with any abnormal test results. I spent 23 minutes involved in the care of this patient preparing to see the patient by obtaining and reviewing her medical history (including labs, imaging tests and prior procedures), documenting clinical information in the electronic health record (EHR), counseling and coordinating care plans, writing and sending prescriptions, ordering tests or procedures and directly communicating with the patient by discussing pertinent items from her history and physical exam as well as detailing my assessment and plan as noted above so that she has an informed understanding.  All of her questions were answered.  Elonda Husky, M.D. 02/14/2020 9:14 AM

## 2020-02-15 ENCOUNTER — Other Ambulatory Visit: Payer: Self-pay | Admitting: Obstetrics and Gynecology

## 2020-02-15 DIAGNOSIS — N938 Other specified abnormal uterine and vaginal bleeding: Secondary | ICD-10-CM

## 2020-02-15 LAB — BETA HCG QUANT (REF LAB): hCG Quant: <1 m[IU]/mL

## 2020-02-28 ENCOUNTER — Ambulatory Visit: Admission: RE | Admit: 2020-02-28 | Payer: Medicaid Other | Source: Ambulatory Visit

## 2020-03-21 ENCOUNTER — Ambulatory Visit: Payer: Medicaid Other

## 2020-03-30 ENCOUNTER — Ambulatory Visit
Admission: RE | Admit: 2020-03-30 | Discharge: 2020-03-30 | Disposition: A | Discharge status: Home / Self Care | Payer: Medicaid Other | Source: Ambulatory Visit | Attending: Obstetrics and Gynecology | Admitting: Obstetrics and Gynecology

## 2020-03-30 ENCOUNTER — Other Ambulatory Visit: Payer: Self-pay

## 2020-03-30 DIAGNOSIS — N938 Other specified abnormal uterine and vaginal bleeding: Secondary | ICD-10-CM | POA: Diagnosis not present

## 2020-03-30 MED ORDER — GADOBUTROL 1 MMOL/ML IV SOLN
6.0000 mL | Freq: Once | INTRAVENOUS | Status: AC | PRN
  Administered 2020-03-30: 6 mL via INTRAVENOUS

## 2020-04-11 ENCOUNTER — Encounter: Payer: Self-pay | Admitting: Obstetrics and Gynecology

## 2020-04-11 ENCOUNTER — Ambulatory Visit (INDEPENDENT_AMBULATORY_CARE_PROVIDER_SITE_OTHER): Payer: Medicaid Other | Admitting: Obstetrics and Gynecology

## 2020-04-11 ENCOUNTER — Other Ambulatory Visit: Payer: Self-pay

## 2020-04-11 VITALS — BP 145/79 | HR 92 | Ht 61.0 in | Wt 125.6 lb

## 2020-04-11 DIAGNOSIS — N80129 Deep endometriosis of ovary, unspecified ovary: Secondary | ICD-10-CM

## 2020-04-11 DIAGNOSIS — N8003 Adenomyosis of the uterus: Secondary | ICD-10-CM

## 2020-04-11 DIAGNOSIS — N809 Endometriosis, unspecified: Secondary | ICD-10-CM

## 2020-04-11 DIAGNOSIS — N800 Endometriosis of uterus: Secondary | ICD-10-CM

## 2020-04-11 DIAGNOSIS — R102 Pelvic and perineal pain: Secondary | ICD-10-CM | POA: Diagnosis not present

## 2020-04-11 NOTE — Progress Notes (Signed)
HPI:      [name] is a 20 y.o. [id number] who LMP was Patient's last menstrual period was 03/22/2020.  Subjective:   She presents today to discuss her most recent findings on MRI.  She continues to have "2 menstrual periods per month, pelvic pain, dysmenorrhea and dyspareunia."   She states that her mother had endometriosis and she is not surprised to find that she has a form of this.    Hx: The following portions of the patient's history were reviewed and updated as appropriate:             She  has a past medical history of Anxiety, Depression, GERD (gastroesophageal reflux disease), Irritable bowel syndrome (IBS) (since childhood), Mental disorder, Pinched nerve (ongoing), and Thoracic outlet syndrome. She does not have any pertinent problems on file. She  has a past surgical history that includes First rib removal; Tonsillectomy; Cesarean section (N/A, 02/09/2017); Cesarean section (N/A, 11/22/2018); Dilation and evacuation (N/A, 08/10/2019); and EMBOLIZATION (N/A, 11/03/2019). Her family history includes Healthy in her mother; Heart failure in her father; Hypertension in her father; Stroke in her father. She  reports that she has never smoked. She has never used smokeless tobacco. She reports that she does not drink alcohol and does not use drugs. She has a current medication list which includes the following prescription(s): desvenlafaxine, ibuprofen, suboxone, ferrous sulfate, and lo loestrin fe. She is allergic to vicodin [hydrocodone-acetaminophen], amoxicillin, and tramadol.       Review of Systems:  Review of Systems  Constitutional: Denied constitutional symptoms, night sweats, recent illness, fatigue, fever, insomnia and weight loss.  Eyes: Denied eye symptoms, eye pain, photophobia, vision change and visual disturbance.  Ears/Nose/Throat/Neck: Denied ear, nose, throat or neck symptoms, hearing loss, nasal discharge, sinus congestion and sore throat.  Cardiovascular:  Denied cardiovascular symptoms, arrhythmia, chest pain/pressure, edema, exercise intolerance, orthopnea and palpitations.  Respiratory: Denied pulmonary symptoms, asthma, pleuritic pain, productive sputum, cough, dyspnea and wheezing.  Gastrointestinal: Denied, gastro-esophageal reflux, melena, nausea and vomiting.  Genitourinary: See HPI for additional information.  Musculoskeletal: Denied musculoskeletal symptoms, stiffness, swelling, muscle weakness and myalgia.  Dermatologic: Denied dermatology symptoms, rash and scar.  Neurologic: Denied neurology symptoms, dizziness, headache, neck pain and syncope.  Psychiatric: Denied psychiatric symptoms, anxiety and depression.  Endocrine: Denied endocrine symptoms including hot flashes and night sweats.   Meds:   Current Outpatient Medications on File Prior to Visit  Medication Sig Dispense Refill  . desvenlafaxine (PRISTIQ) 100 MG 24 hr tablet Take 100 mg by mouth every morning.    Marland Kitchen ibuprofen (ADVIL) 800 MG tablet Take 1 tablet (800 mg total) by mouth every 8 (eight) hours as needed. 60 tablet 0  . SUBOXONE 8-2 MG FILM Place 1 Film under the tongue daily.    . ferrous sulfate 324 MG TBEC Take 1 tablet (324 mg total) by mouth 2 (two) times daily with a meal. (Patient not taking: Reported on 04/11/2020) 30 tablet 1  . Norethindrone-Ethinyl Estradiol-Fe Biphas (LO LOESTRIN FE) 1 MG-10 MCG / 10 MCG tablet Take 1 tablet by mouth at bedtime. (Patient not taking: Reported on 02/14/2020) 84 tablet 0   No current facility-administered medications on file prior to visit.       The pregnancy intention screening data noted above was reviewed. Potential methods of contraception were discussed. The patient elected to proceed with No Method - No Contraceptive Precautions.     Objective:     Vitals:   04/11/20 1341  BP: (!) 145/79  Pulse: 92   Filed Weights   04/11/20 1341  Weight: 125 lb 9.6 oz (57 kg)              MR RESULTS  IMPRESSION: 1.  Complete resolution of previously visualized hypervascular right cornual region uterine mass. 2. Persistent focal segmental adenomyosis in the anterior upper uterine body, mildly improved since 10/20/2019 MRI. No uterine fibroids. 3. Small 1.4 cm right adnexal peritubal hemorrhagic focus without enhancement, potentially a small endometrioma. Suggest pelvic ultrasound or MRI pelvis without and with IV contrast follow-up in 3-6 months.  Assessment:    [id number] Patient Active Problem List   Diagnosis Date Noted  . Dysfunctional uterine bleeding 10/28/2019  . Uterine mass 10/28/2019  . Cesarean delivery delivered 11/22/2018  . History of cesarean delivery 06/16/2018  . Bipolar affective disorder in remission (HCC) 08/19/2016  . Severe episode of recurrent major depressive disorder, without psychotic features (HCC)   . Insomnia 06/27/2015  . MDD (major depressive disorder) 06/25/2015  . Moderate episode of recurrent major depressive disorder (HCC) 04/23/2015  . History of trauma 04/11/2015  . Miscarriage 04/11/2015  . Polysubstance dependence in early, early partial, sustained full, or sustained partial remission (HCC) 04/11/2015  . Recurrent major depressive disorder, in full remission (HCC) 04/11/2015  . OSA (obstructive sleep apnea) 09/19/2014  . Anxiety 09/18/2014  . Chronic pain of right ankle 09/18/2014  . 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 Constipation 09/15/2014  . Chronic tonsillitis 12/02/2013  . Cervical rib 02/17/2012  . Hereditary and idiopathic peripheral neuropathy 02/17/2012     1. Adenomyosis   2. Endometrioma   3. Pelvic pain in female        Plan:            1.  Adenomyosis, endometriosis, endometrioma discussed in detail.  2.  Literature on adenomyosis given.  3.  Treatment options discussed in detail from NSAID S all the way to hysterectomy.  Patient is convinced that she would like endometrial ablation to stop her  frequent menses.  She says if this fails she would like hysterectomy.  Declined OCPs, hormonal IUD, Depo-Provera etc.   Referred to Dr. Valentino Saxon Orders No orders of the defined types were placed in this encounter.   No orders of the defined types were placed in this encounter.     F/U  Return in about 2 weeks (around 04/25/2020). I spent 23 minutes involved in the care of this patient preparing to see the patient by obtaining and reviewing her medical history (including labs, imaging tests and prior procedures), documenting clinical information in the electronic health record (EHR), counseling and coordinating care plans, writing and sending prescriptions, ordering tests or procedures and directly communicating with the patient by discussing pertinent items from her history and physical exam as well as detailing my assessment and plan as noted above so that she has an informed understanding.  All of her questions were answered.  Elonda Husky, M.D. 04/11/2020 2:08 PM

## 2020-06-13 ENCOUNTER — Other Ambulatory Visit: Payer: Self-pay

## 2020-06-13 ENCOUNTER — Observation Stay
Admission: EM | Admit: 2020-06-13 | Discharge: 2020-06-14 | Disposition: A | Discharge status: Home / Self Care | Payer: Medicaid Other | Attending: Internal Medicine | Admitting: Internal Medicine

## 2020-06-13 DIAGNOSIS — T50901A Poisoning by unspecified drugs, medicaments and biological substances, accidental (unintentional), initial encounter: Secondary | ICD-10-CM | POA: Diagnosis present

## 2020-06-13 DIAGNOSIS — Z20822 Contact with and (suspected) exposure to covid-19: Secondary | ICD-10-CM | POA: Insufficient documentation

## 2020-06-13 DIAGNOSIS — T1491XA Suicide attempt, initial encounter: Secondary | ICD-10-CM | POA: Diagnosis not present

## 2020-06-13 DIAGNOSIS — I959 Hypotension, unspecified: Secondary | ICD-10-CM | POA: Insufficient documentation

## 2020-06-13 DIAGNOSIS — T465X2A Poisoning by other antihypertensive drugs, intentional self-harm, initial encounter: Secondary | ICD-10-CM

## 2020-06-13 DIAGNOSIS — T465X1A Poisoning by other antihypertensive drugs, accidental (unintentional), initial encounter: Principal | ICD-10-CM | POA: Insufficient documentation

## 2020-06-13 DIAGNOSIS — X788XXA Intentional self-harm by other sharp object, initial encounter: Secondary | ICD-10-CM | POA: Diagnosis not present

## 2020-06-13 DIAGNOSIS — F329 Major depressive disorder, single episode, unspecified: Secondary | ICD-10-CM | POA: Diagnosis present

## 2020-06-13 DIAGNOSIS — T50902A Poisoning by unspecified drugs, medicaments and biological substances, intentional self-harm, initial encounter: Secondary | ICD-10-CM | POA: Diagnosis not present

## 2020-06-13 DIAGNOSIS — F1110 Opioid abuse, uncomplicated: Secondary | ICD-10-CM

## 2020-06-13 LAB — CBC
HCT: 38.2 % (ref 36.0–46.0)
Hemoglobin: 12.9 g/dL (ref 12.0–15.0)
MCH: 28.8 pg (ref 26.0–34.0)
MCHC: 33.8 g/dL (ref 30.0–36.0)
MCV: 85.3 fL (ref 80.0–100.0)
Platelets: 207 10*3/uL (ref 150–400)
RBC: 4.48 MIL/uL (ref 3.87–5.11)
RDW: 13.1 % (ref 11.5–15.5)
WBC: 11.1 10*3/uL — ABNORMAL HIGH (ref 4.0–10.5)
nRBC: 0 % (ref 0.0–0.2)

## 2020-06-13 LAB — COMPREHENSIVE METABOLIC PANEL
ALT: 13 U/L (ref 0–44)
AST: 15 U/L (ref 15–41)
Albumin: 4.1 g/dL (ref 3.5–5.0)
Alkaline Phosphatase: 64 U/L (ref 38–126)
Anion gap: 10 (ref 5–15)
BUN: 12 mg/dL (ref 6–20)
CO2: 25 mmol/L (ref 22–32)
Calcium: 8.8 mg/dL — ABNORMAL LOW (ref 8.9–10.3)
Chloride: 103 mmol/L (ref 98–111)
Creatinine, Ser: 0.62 mg/dL (ref 0.44–1.00)
GFR, Estimated: >60 mL/min (ref >60)
Glucose, Bld: 125 mg/dL — ABNORMAL HIGH (ref 70–99)
Potassium: 3.9 mmol/L (ref 3.5–5.1)
Sodium: 138 mmol/L (ref 135–145)
Total Bilirubin: 0.4 mg/dL (ref 0.3–1.2)
Total Protein: 6.9 g/dL (ref 6.5–8.1)

## 2020-06-13 LAB — POC URINE PREG, ED: Preg Test, Ur: NEGATIVE

## 2020-06-13 MED ORDER — SODIUM CHLORIDE 0.9 % IV BOLUS (SEPSIS)
1000.0000 mL | Freq: Once | INTRAVENOUS | Status: AC
  Administered 2020-06-13: 1000 mL via INTRAVENOUS

## 2020-06-13 MED ORDER — SODIUM CHLORIDE 0.9 % IV BOLUS (SEPSIS)
1000.0000 mL | Freq: Once | INTRAVENOUS | Status: AC
  Administered 2020-06-14: 1000 mL via INTRAVENOUS

## 2020-06-13 NOTE — ED Notes (Signed)
Pt reports taking 6 clonidine tonight at 1900.  Pt states she got mad because she didn't have a [employer] administrator to go to [facility].  Pt reports SI.  Pt reports drug use today.  Denies etoh use.  Pt alert calm and cooperative. Sinus brady on monitor.  md at bedside.  Iv in place.  Sitter with pt

## 2020-06-13 NOTE — ED Triage Notes (Signed)
Pt in with drug overdose states took 6 clonidine 0.1 mg tabs states over 1 hr pta. Pt states states it was a suicide attempt, hx of the same.

## 2020-06-13 NOTE — ED Notes (Addendum)
Pt stated to this tech, "I relapsed this morning and did coke. I also took percocet."

## 2020-06-14 DIAGNOSIS — F1110 Opioid abuse, uncomplicated: Secondary | ICD-10-CM

## 2020-06-14 DIAGNOSIS — T465X2A Poisoning by other antihypertensive drugs, intentional self-harm, initial encounter: Secondary | ICD-10-CM | POA: Diagnosis not present

## 2020-06-14 DIAGNOSIS — T50902A Poisoning by unspecified drugs, medicaments and biological substances, intentional self-harm, initial encounter: Secondary | ICD-10-CM

## 2020-06-14 DIAGNOSIS — T50901A Poisoning by unspecified drugs, medicaments and biological substances, accidental (unintentional), initial encounter: Secondary | ICD-10-CM | POA: Diagnosis present

## 2020-06-14 LAB — URINE DRUG SCREEN, QUALITATIVE (ARMC ONLY)
Amphetamines, Ur Screen: NOT DETECTED
Barbiturates, Ur Screen: NOT DETECTED
Benzodiazepine, Ur Scrn: NOT DETECTED
Cannabinoid 50 Ng, Ur ~~LOC~~: NOT DETECTED
Cocaine Metabolite,Ur ~~LOC~~: POSITIVE — AB
MDMA (Ecstasy)Ur Screen: NOT DETECTED
Methadone Scn, Ur: NOT DETECTED
Opiate, Ur Screen: POSITIVE — AB
Phencyclidine (PCP) Ur S: NOT DETECTED
Tricyclic, Ur Screen: NOT DETECTED

## 2020-06-14 LAB — ACETAMINOPHEN LEVEL: Acetaminophen (Tylenol), Serum: <10 ug/mL — ABNORMAL LOW (ref 10–30)

## 2020-06-14 LAB — RESP PANEL BY RT-PCR (FLU A&B, COVID) ARPGX2
Influenza A by PCR: NEGATIVE
Influenza B by PCR: NEGATIVE
SARS Coronavirus 2 by RT PCR: NEGATIVE

## 2020-06-14 LAB — URINALYSIS, COMPLETE (UACMP) WITH MICROSCOPIC
Bacteria, UA: NONE SEEN
Bilirubin Urine: NEGATIVE
Glucose, UA: NEGATIVE mg/dL
Hgb urine dipstick: NEGATIVE
Ketones, ur: NEGATIVE mg/dL
Nitrite: NEGATIVE
Protein, ur: NEGATIVE mg/dL
Specific Gravity, Urine: 1.02 (ref 1.005–1.030)
pH: 5 (ref 5.0–8.0)

## 2020-06-14 LAB — ETHANOL: Alcohol, Ethyl (B): <10 mg/dL (ref <10)

## 2020-06-14 LAB — SALICYLATE LEVEL: Salicylate Lvl: <7 mg/dL — ABNORMAL LOW (ref 7.0–30.0)

## 2020-06-14 MED ORDER — DESVENLAFAXINE SUCCINATE ER 50 MG PO TB24
200.0000 mg | ORAL_TABLET | Freq: Every day | ORAL | Status: DC
  Administered 2020-06-14: 200 mg via ORAL
  Filled 2020-06-14: qty 4

## 2020-06-14 MED ORDER — BUPRENORPHINE HCL-NALOXONE HCL 8-2 MG SL SUBL: 0.5000 | SUBLINGUAL_TABLET | Freq: Two times a day (BID) | SUBLINGUAL | 0 refills | Status: DC

## 2020-06-14 MED ORDER — NALOXONE HCL 2 MG/2ML IJ SOSY: 0.4000 mg | PREFILLED_SYRINGE | INTRAMUSCULAR | Status: DC | PRN

## 2020-06-14 MED ORDER — LACTATED RINGERS IV BOLUS
1000.0000 mL | Freq: Once | INTRAVENOUS | Status: AC
  Administered 2020-06-14: 1000 mL via INTRAVENOUS

## 2020-06-14 MED ORDER — NOREPINEPHRINE 4 MG/250ML-% IV SOLN
0.0000 ug/min | INTRAVENOUS | Status: DC
  Administered 2020-06-14: 1 ug/min via INTRAVENOUS
  Administered 2020-06-14: 2 ug/min via INTRAVENOUS
  Filled 2020-06-14: qty 250

## 2020-06-14 MED ORDER — ATROPINE SULFATE 1 MG/10ML IJ SOSY: 1.0000 mg | PREFILLED_SYRINGE | INTRAMUSCULAR | Status: DC | PRN

## 2020-06-14 MED ORDER — SODIUM CHLORIDE 0.9 % IV BOLUS (SEPSIS)
1000.0000 mL | Freq: Once | INTRAVENOUS | Status: AC
  Administered 2020-06-14: 1000 mL via INTRAVENOUS

## 2020-06-14 MED ORDER — LACTATED RINGERS IV SOLN: INTRAVENOUS | Status: DC

## 2020-06-14 MED ORDER — POLYETHYLENE GLYCOL 3350 17 G PO PACK: 17.0000 g | PACK | Freq: Every day | ORAL | Status: DC | PRN

## 2020-06-14 MED ORDER — DESVENLAFAXINE SUCCINATE ER 100 MG PO TB24: 200.0000 mg | ORAL_TABLET | Freq: Every day | ORAL | 3 refills | Status: AC

## 2020-06-14 MED ORDER — DOCUSATE SODIUM 100 MG PO CAPS: 100.0000 mg | ORAL_CAPSULE | Freq: Two times a day (BID) | ORAL | Status: DC | PRN

## 2020-06-14 MED ORDER — BUPRENORPHINE HCL-NALOXONE HCL 8-2 MG SL SUBL
0.5000 | SUBLINGUAL_TABLET | Freq: Two times a day (BID) | SUBLINGUAL | Status: DC
Start: 2020-06-14 — End: 2020-06-14
  Administered 2020-06-14: 0.5 via SUBLINGUAL
  Filled 2020-06-14: qty 1

## 2020-06-14 NOTE — ED Notes (Signed)
RT called for ABG.

## 2020-06-14 NOTE — ED Notes (Signed)
Pt sleeping sinus brady on monitor
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
## 2020-06-14 NOTE — ED Notes (Addendum)
EDT made RN aware pt BP trending down. Levophed initiated per order parameters. Pt denies any complaints. MD made aware.

## 2020-06-14 NOTE — ED Notes (Signed)
IVC  GOING  TO  MEDICAL  FLOOR 

## 2020-06-14 NOTE — ED Notes (Signed)
md at bedside with pt.  Iv meds started   Sitter with pt.

## 2020-06-14 NOTE — ED Notes (Signed)
Ouma, NP at bedside.  

## 2020-06-14 NOTE — ED Notes (Signed)
Patient resting in bed with eyes closed. Easily arouses to voice. Respirations even and unlabored. NAD. Safety sitter at bedside. Will continue to monitor

## 2020-06-14 NOTE — ED Notes (Signed)
Pt given phone to speak with family  

## 2020-06-14 NOTE — ED Notes (Signed)
POISON CONTROL CONTACTED: SPOKE WITH PATTY  RECOMMENDATIONS:   Monitor for hypotension and bradycardia. Provide IV fluids for blood pressure and administer Atropine if HR <30. Observation 4-6 hours after blood pressure normalizes.
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
## 2020-06-14 NOTE — ED Notes (Signed)
Pt unhooked and walked independently to bathroom

## 2020-06-14 NOTE — ED Notes (Signed)
2nd iv placed with iv fluids infusing.  Pt alert and talking  Sinus brady on monitor.  Sitter with pt.

## 2020-06-14 NOTE — H&P (Addendum)
NAME:  Jaclyn Kelly, MRN:  [id number], DOB:  2000/03/05, LOS: 0 ADMISSION DATE:  06/13/2020, CONSULTATION DATE: 06/14/2020 REFERRING MD: Rochele Raring MD, CHIEF COMPLAINT: Drug Overdose   History of present illness   20 year old female with significant past medical history of mental disorder with suicidal ideation presenting to the ED with possible intentional drug overdose.  States that she took 6 clonidine 0.1 mg tabs in an attempted suicide.  She has history of similar in the past.  She is currently on Suboxone for opiate addiction which apparently she ran out of and became upset prior to taking her prescribed clonidine tablets.  Patient admits to taking Percocet and using cocaine that night.  No reports of other associated symptoms.  On arrival to the ED, she was afebrile with blood pressure 94/67 mm Hg and pulse rate 77 beats/min. There were no focal neurological deficits; she was alert and oriented x4, and he did not demonstrate any memory deficits.  She was cooperative with my exam at the bedside and denies any other concerns other than mild chest discomfort.  Initial  Labs revealed WBC 11.1, MCV 78 MCH 25.5 otherwise unremarkable CBC.  Glucose 125, calcium 8.8 otherwise unremarkable CMP.  Ethanol < 10 mg/d, acetaminophen level <10ug/ml, salicylate level <7.0.  UA negative for UTI.  UDS positive for cocaine and opiates.  EKG showed sinus bradycardia with left atrial enlargement, minimal ST depression in inferior leads.  Patient received IV fluid boluses up to 3 L.  She remained hypotensive with blood pressures in 80s systolic despite IV fluid resuscitation therefore she was started on peripheral Levophed and PCCM consulted.   Past Medical History  Anxiety and depression IBS Mental disorder Bipolar/manic depression GERD Thoracic outlet syndrome Hereditary and idiopathic peripheral neuropathy OSA Polysubstance dependency  Significant Hospital Events   06/13/2020: Admitted to stepdown  for drug overdose  Consults:  PCCM  Procedures:  None  Significant Diagnostic Tests:  None  Micro Data:  06/13/2020: SARS-CoV-2 PCR>> negative 06/13/2020: Influenza PCR>> negative  Antimicrobials:  None  OBJECTIVE  Blood pressure (!) 89/55, pulse (!) 56, temperature 98.2 F (36.8 C), temperature source Oral, resp. rate (!) 22, height 5\' 2"  (1.575 m), weight 60.3 kg, SpO2 100 %, unknown if currently breastfeeding.       No intake or output data in the 24 hours ending 06/14/20 0329 Filed Weights   06/13/20 2314  Weight: 60.3 kg     Physical Examination  GENERAL:20 year-old patient lying in the bed with no acute distress.  EYES: Pupils equal, round, reactive to light and accommodation. No scleral icterus. Extraocular muscles intact.  HEENT: Head atraumatic, normocephalic. Oropharynx and nasopharynx clear.  NECK:  Supple, no jugular venous distention. No thyroid enlargement, no tenderness.  LUNGS: Normal breath sounds bilaterally, no wheezing, rales,rhonchi or crepitation. No use of accessory muscles of respiration.  CARDIOVASCULAR: S1, S2 normal. No murmurs, rubs, or gallops.  ABDOMEN: Soft, nontender, nondistended. Bowel sounds present. No organomegaly or mass.  EXTREMITIES: No pedal edema, cyanosis, or clubbing.  NEUROLOGIC: Cranial nerves II through XII are intact.  Muscle strength 5/5 in all extremities. Sensation intact. Gait not checked.  PSYCHIATRIC: The patient is alert and oriented x 3.  SKIN: No obvious rash, lesion, or ulcer.   Labs/imaging that I havepersonally reviewed  (right click and "Reselect all SmartList Selections" daily)  EKG: normal EKG, normal sinus rhythm, unchanged from previous tracings, sinus bradycardia.     Labs   CBC: Recent Labs  Lab 06/13/20  2317  WBC 11.1*  HGB 12.9  HCT 38.2  MCV 85.3  PLT 207    Basic Metabolic Panel: Recent Labs  Lab 06/13/20 2317  NA 138  K 3.9  CL 103  CO2 25  GLUCOSE 125*  BUN 12  CREATININE 0.62   CALCIUM 8.8*   GFR: Estimated Creatinine Clearance: 96 mL/min (by C-G formula based on SCr of 0.62 mg/dL). Recent Labs  Lab 06/13/20 2317  WBC 11.1*    Liver Function Tests: Recent Labs  Lab 06/13/20 2317  AST 15  ALT 13  ALKPHOS 64  BILITOT 0.4  PROT 6.9  ALBUMIN 4.1   No results for input(s): LIPASE, AMYLASE in the last 168 hours. No results for input(s): AMMONIA in the last 168 hours.  ABG No results found for: PHART, PCO2ART, PO2ART, HCO3, TCO2, ACIDBASEDEF, O2SAT   Coagulation Profile: No results for input(s): INR, PROTIME in the last 168 hours.  Cardiac Enzymes: No results for input(s): CKTOTAL, CKMB, CKMBINDEX, TROPONINI in the last 168 hours.  HbA1C: Hgb A1c MFr Bld  Date/Time Value Ref Range Status  12/06/2019 02:42 PM 5.5 4.8 - 5.6 % Final    Comment:             Prediabetes: 5.7 - 6.4          Diabetes: >6.4          Glycemic control for adults with diabetes: <7.0   06/16/2018 12:20 PM 5.2 4.8 - 5.6 % Final    Comment:             Prediabetes: 5.7 - 6.4          Diabetes: >6.4          Glycemic control for adults with diabetes: <7.0     CBG: No results for input(s): GLUCAP in the last 168 hours.  Review of Systems:   Review of Systems  Constitutional: Negative.   HENT: Negative.   Eyes: Negative.   Respiratory: Negative.   Cardiovascular: Negative.   Gastrointestinal: Negative.   Genitourinary: Negative.   Musculoskeletal: Negative.   Skin: Negative.   Neurological: Negative.   Endo/Heme/Allergies: Negative.   Psychiatric/Behavioral: Positive for depression, substance abuse and suicidal ideas. The patient is nervous/anxious and has insomnia.     Past Medical History  She,  has a past medical history of Anxiety, Depression, GERD (gastroesophageal reflux disease), Irritable bowel syndrome (IBS) (since childhood), Mental disorder, Pinched nerve (ongoing), and Thoracic outlet syndrome.   Surgical History    Past Surgical History:   Procedure Laterality Date  . CESAREAN SECTION N/A 02/09/2017   Procedure: CESAREAN SECTION;  Surgeon: Linzie Collin, MD;  Location: [facility] ORS;  Service: Obstetrics;  Laterality: N/A;  . CESAREAN SECTION N/A 11/22/2018   Procedure: CESAREAN SECTION REPEAT;  Surgeon: Linzie Collin, MD;  Location: [facility] ORS;  Service: Obstetrics;  Laterality: N/A;  . DILATION AND EVACUATION N/A 08/10/2019   Procedure: DILATATION AND EVACUATION;  Surgeon: Linzie Collin, MD;  Location: [facility] ORS;  Service: Gynecology;  Laterality: N/A;  . EMBOLIZATION N/A 11/03/2019   Procedure: EMBOLIZATION;  Surgeon: Annice Needy, MD;  Location: [facility] INVASIVE CV LAB;  Service: Cardiovascular;  Laterality: N/A;  . FIRST RIB REMOVAL    . TONSILLECTOMY       Social History   reports that she has never smoked. She has never used smokeless tobacco. She reports that she does not drink alcohol and does not use drugs.   Family History  Her family history includes Healthy in her mother; Heart failure in her father; Hypertension in her father; Stroke in her father.   Allergies Allergies  Allergen Reactions  . Vicodin [Hydrocodone-Acetaminophen] Itching and Nausea Only  . Amoxicillin Hives and Rash    Did it involve swelling of the face/tongue/throat, SOB, or low BP? No Did it involve sudden or severe rash/hives, skin peeling, or any reaction on the inside of your mouth or nose? No Did you need to seek medical attention at a hospital or doctor's office? No When did it last happen? unknown If all above answers are "NO", may proceed with cephalosporin use. Yeast Infection  . Tramadol Nausea And Vomiting, Rash and Swelling     Home Medications  Prior to Admission medications   Medication Sig Start Date End Date Taking? Authorizing Provider  desvenlafaxine (PRISTIQ) 100 MG 24 hr tablet Take 100 mg by mouth every morning. 10/11/19   [provider]  ferrous sulfate 324 MG TBEC Take 1 tablet (324 mg total) by  mouth 2 (two) times daily with a meal. Patient not taking: Reported on 04/11/2020 12/16/19   Linzie Collin, MD  ibuprofen (ADVIL) 800 MG tablet Take 1 tablet (800 mg total) by mouth every 8 (eight) hours as needed. 02/01/20   Linzie Collin, MD  Norethindrone-Ethinyl Estradiol-Fe Biphas (LO LOESTRIN FE) 1 MG-10 MCG / 10 MCG tablet Take 1 tablet by mouth at bedtime. Patient not taking: Reported on 02/14/2020 12/06/19 02/28/20  Linzie Collin, MD  SUBOXONE 8-2 MG FILM Place 1 Film under the tongue daily. 07/26/19   [provider]    Scheduled Meds: Continuous Infusions: . norepinephrine (LEVOPHED) Adult infusion 2 mcg/min (06/14/20 0246)   PRN Meds:.docusate sodium, polyethylene glycol  Active Hospital Problem list   Intentional drug overdose Hypertension Bradycardia Major depressive disorder with suicidal ideation and attempt Polysubstance abuse  Assessment & Plan:   Intentional Drug Overdose Drug of abuse: Clonidine, admits to taking 6 tablets 0.1 mg Cardiac side effect (alpha2): Hypotension and Bradycardia -Continuous cardiac monitoring -Supplemental O2 as needed to maintain O2 saturations > 92% -High risk for intubation -Monitor for hypoventilation, hypoxia, Cheyne-Stokes respiration and periodic apnea -Monitor for hypothermia -Naloxone as needed -Monitor for withdrawal from clonidine(tachycardia, hypertension, tremors, and agitation) -Frequent neurochecks  Hypotension Likely cardiac(alpha2) side effect of clonidine as above -Aggressive IV fluids -Continue vasopressor Maintain MAP greater than 65 wean as tolerated  Bradycardia Likely side effect of clonidine as above -Atropine as needed  Major depressive disorder with suicidal ideation PMHx: Mental disorder, anxiety, bipolar disorder -Continue IVC -Psych consult  Polysubstance abuse disorder UDS positive for cocaine plus opiates -Counseling as appropriate -Social work consult    Best practice:   Diet:  Oral Pain/Anxiety/Delirium protocol (if indicated): No VAP protocol (if indicated): Not indicated DVT prophylaxis: SCD GI prophylaxis: PPI Glucose control:  SSI No Central venous access:  N/A Arterial line:  N/A Foley:  N/A Mobility:  bed rest  PT consulted: N/A Last date of multidisciplinary goals of care discussion [06/14/2020] Code Status:  full code Disposition: Stepdown  Critical care time: 74     Webb Silversmith, DNP, CCRN, FNP-C, AGACNP-BC Acute Care Nurse Practitioner  [facility] Pager: [phone] [place] [facility]  .

## 2020-06-14 NOTE — ED Notes (Signed)
Call received from poison control regarding patient status. Informed poison control that patient is currently requiring vasopressors to maintain a MAP>65. Informed poison control representative will call back in a few hours for another update.

## 2020-06-14 NOTE — ED Notes (Signed)
Patient laying in bed with eyes open. Safety sitter at bedside. Calm and cooperative. Respirations even and unlabored. NAD. Will continue to monitor.

## 2020-06-14 NOTE — ED Provider Notes (Signed)
[facility] Emergency Department Provider Note  ____________________________________________   Event Date/Time   First MD Initiated Contact with Patient 06/13/20 2347     (approximate)  I have reviewed the triage vital signs and the nursing notes.   HISTORY  Chief Complaint Drug Overdose    HPI Jaclyn Kelly is a 20 y.o. female with history of bipolar disorder, previous suicide attempt, opioid addiction on Suboxone who presents to the emergency department after she had an intentional overdose tonight.  States that she was out of her Suboxone, became upset and took six 0.1 mg clonidine tablets around 7 PM in an attempt to harm herself.  She also reports that she took 1 Percocet because she started having "shakes" and used cocaine tonight.  She states this was a suicide attempt.  She has had previous suicide attempts before.  She denies any pain.  No vomiting, diarrhea, dizziness.        Past Medical History:  Diagnosis Date  . Anxiety   . Depression   . GERD (gastroesophageal reflux disease)    NO MEDS  . Irritable bowel syndrome (IBS) since childhood  . Mental disorder    Bipolar/ Manic Depression  . Pinched nerve ongoing   in foot, with inflammation  . Thoracic outlet syndrome     Patient Active Problem List   Diagnosis Date Noted  . Dysfunctional uterine bleeding 10/28/2019  . Uterine mass 10/28/2019  . Cesarean delivery delivered 11/22/2018  . History of cesarean delivery 06/16/2018  . Bipolar affective disorder in remission (HCC) 08/19/2016  . Severe episode of recurrent major depressive disorder, without psychotic features (HCC)   . Insomnia 06/27/2015  . MDD (major depressive disorder) 06/25/2015  . Moderate episode of recurrent major depressive disorder (HCC) 04/23/2015  . History of trauma 04/11/2015  . Miscarriage 04/11/2015  . Polysubstance dependence in early, early partial, sustained full, or sustained partial remission  (HCC) 04/11/2015  . Recurrent major depressive disorder, in full remission (HCC) 04/11/2015  . OSA (obstructive sleep apnea) 09/19/2014  . Anxiety 09/18/2014  . Chronic pain of right ankle 09/18/2014  . 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 Constipation 09/15/2014  . Chronic tonsillitis 12/02/2013  . Cervical rib 02/17/2012  . Hereditary and idiopathic peripheral neuropathy 02/17/2012    Past Surgical History:  Procedure Laterality Date  . CESAREAN SECTION N/A 02/09/2017   Procedure: CESAREAN SECTION;  Surgeon: Linzie Collin, MD;  Location: [facility] ORS;  Service: Obstetrics;  Laterality: N/A;  . CESAREAN SECTION N/A 11/22/2018   Procedure: CESAREAN SECTION REPEAT;  Surgeon: Linzie Collin, MD;  Location: [facility] ORS;  Service: Obstetrics;  Laterality: N/A;  . DILATION AND EVACUATION N/A 08/10/2019   Procedure: DILATATION AND EVACUATION;  Surgeon: Linzie Collin, MD;  Location: [facility] ORS;  Service: Gynecology;  Laterality: N/A;  . EMBOLIZATION N/A 11/03/2019   Procedure: EMBOLIZATION;  Surgeon: Annice Needy, MD;  Location: [facility] INVASIVE CV LAB;  Service: Cardiovascular;  Laterality: N/A;  . FIRST RIB REMOVAL    . TONSILLECTOMY      Prior to Admission medications   Medication Sig Start Date End Date Taking? Authorizing Provider  desvenlafaxine (PRISTIQ) 100 MG 24 hr tablet Take 100 mg by mouth every morning. 10/11/19   [provider]  ferrous sulfate 324 MG TBEC Take 1 tablet (324 mg total) by mouth 2 (two) times daily with a meal. Patient not taking: Reported on 04/11/2020 12/16/19   Linzie Collin,  MD  ibuprofen (ADVIL) 800 MG tablet Take 1 tablet (800 mg total) by mouth every 8 (eight) hours as needed. 02/01/20   Linzie Collin, MD  Norethindrone-Ethinyl Estradiol-Fe Biphas (LO LOESTRIN FE) 1 MG-10 MCG / 10 MCG tablet Take 1 tablet by mouth at bedtime. Patient not taking: Reported on 02/14/2020 12/06/19 02/28/20  Linzie Collin, MD   SUBOXONE 8-2 MG FILM Place 1 Film under the tongue daily. 07/26/19   [provider]    Allergies Vicodin [hydrocodone-acetaminophen], Amoxicillin, and Tramadol  Family History  Problem Relation Age of Onset  . Hypertension Father   . Stroke Father   . Heart failure Father   . Healthy Mother     Social History Social History   Tobacco Use  . Smoking status: Never Smoker  . Smokeless tobacco: Never Used  Vaping Use  . Vaping Use: Every day  . Substances: Nicotine  Substance Use Topics  . Alcohol use: No  . Drug use: No    Review of Systems Constitutional: No fever. Eyes: No visual changes. ENT: No sore throat. Cardiovascular: Denies chest pain. Respiratory: Denies shortness of breath. Gastrointestinal: No nausea, vomiting, diarrhea. Genitourinary: Negative for dysuria. Musculoskeletal: Negative for back pain. Skin: Negative for rash. Neurological: Negative for focal weakness or numbness.  ____________________________________________   PHYSICAL EXAM:  VITAL SIGNS: ED Triage Vitals  Enc Vitals Group     BP 06/13/20 2312 94/67     Pulse Rate 06/13/20 2312 77     Resp 06/13/20 2312 18     Temp 06/13/20 2312 97.7 F (36.5 C)     Temp Source 06/13/20 2312 Oral     SpO2 06/13/20 2312 98 %     Weight 06/13/20 2314 133 lb (60.3 kg)     Height 06/13/20 2313 5\' 2"  (1.575 m)     Head Circumference --      Peak Flow --      Pain Score 06/13/20 2313 0     Pain Loc --      Pain Edu? --      Excl. in GC? --    CONSTITUTIONAL: Alert and oriented and responds appropriately to questions. Well-appearing; well-nourished HEAD: Normocephalic EYES: Conjunctivae clear, pupils appear equal, EOM appear intact ENT: normal nose; moist mucous membranes NECK: Supple, normal ROM CARD: RRR; S1 and S2 appreciated; no murmurs, no clicks, no rubs, no gallops RESP: Normal chest excursion without splinting or tachypnea; breath sounds clear and equal bilaterally; no wheezes,  no rhonchi, no rales, no hypoxia or respiratory distress, speaking full sentences ABD/GI: Normal bowel sounds; non-distended; soft, non-tender, no rebound, no guarding, no peritoneal signs, no hepatosplenomegaly BACK: The back appears normal EXT: Normal ROM in all joints; no deformity noted, no edema; no cyanosis SKIN: Normal color for age and race; warm; no rash on exposed skin NEURO: Moves all extremities equally, normal speech, no facial asymmetry PSYCH: Patient reports that she is suicidal.  ____________________________________________   LABS (all labs ordered are listed, but only abnormal results are displayed)  Labs Reviewed  CBC - Abnormal; Notable for the following components:      Result Value   WBC 11.1 (*)    All other components within normal limits  COMPREHENSIVE METABOLIC PANEL - Abnormal; Notable for the following components:   Glucose, Bld 125 (*)    Calcium 8.8 (*)    All other components within normal limits  URINALYSIS, COMPLETE (UACMP) WITH MICROSCOPIC - Abnormal; Notable for the following components:  Color, Urine YELLOW (*)    APPearance CLOUDY (*)    Leukocytes,Ua MODERATE (*)    All other components within normal limits  URINE DRUG SCREEN, QUALITATIVE ([facility] ONLY) - Abnormal; Notable for the following components:   Cocaine Metabolite,Ur [place] POSITIVE (*)    Opiate, Ur Screen POSITIVE (*)    All other components within normal limits  ACETAMINOPHEN LEVEL - Abnormal; Notable for the following components:   Acetaminophen (Tylenol), Serum <10 (*)    All other components within normal limits  SALICYLATE LEVEL - Abnormal; Notable for the following components:   Salicylate Lvl <7.0 (*)    All other components within normal limits  RESP PANEL BY RT-PCR (FLU A&B, COVID) ARPGX2  ETHANOL  POC URINE PREG, ED   ____________________________________________  EKG   EKG Interpretation  Date/Time:  Thursday June 14 2020 00:03:37 EDT Ventricular Rate:  58 PR  Interval:  122 QRS Duration: 100 QT Interval:  447 QTC Calculation: 439 R Axis:   88 Text Interpretation: Sinus rhythm Consider left atrial enlargement Minimal ST depression, inferior leads Confirmed by Rochele Raring [phone]) on 06/14/2020 12:22:24 AM       ____________________________________________  RADIOLOGY Normajean Baxter Lavone Weisel, personally viewed and evaluated these images (plain radiographs) as part of my medical decision making, as well as reviewing the written report by the radiologist.  ED MD interpretation:    Official radiology report(s): No results found.  ____________________________________________   PROCEDURES  Procedure(s) performed (including Critical Care):  Procedures  CRITICAL CARE Performed by: Rochele Raring   Total critical care time: 65 minutes  Critical care time was exclusive of separately billable procedures and treating other patients.  Critical care was necessary to treat or prevent imminent or life-threatening deterioration.  Critical care was time spent personally by me on the following activities: development of treatment plan with patient and/or surrogate as well as nursing, discussions with consultants, evaluation of patient's response to treatment, examination of patient, obtaining history from patient or surrogate, ordering and performing treatments and interventions, ordering and review of laboratory studies, ordering and review of radiographic studies, pulse oximetry and re-evaluation of patient's condition.  ____________________________________________   INITIAL IMPRESSION / ASSESSMENT AND PLAN / ED COURSE  As part of my medical decision making, I reviewed the following data within the electronic MEDICAL RECORD NUMBER Nursing notes reviewed and incorporated, Labs reviewed , EKG interpreted , Old EKG reviewed, Old chart reviewed, Discussed with admitting physician  and Notes from prior ED visits          Patient here after intentional  overdose on clonidine tablets around 7 PM tonight.  Poison control contacted and recommended monitoring for hypotension, bradycardia.  She has mildly hypotensive here but normal heart rate.  No interval abnormalities on EKG.  Will give IV fluids.  Screening labs, urine pending.  I have placed patient under full IVC.  ED PROGRESS  1:45 AM  Pt's labs unremarkable.  Urinalysis does show moderate leukocytes, 6-10 red blood cells 11-20 white blood cells but no bacteria and many squamous cells.  She is not having urinary symptoms.  Suspect this is a dirty catch.  Drug screen positive for cocaine and opiates.  Her blood pressures are down to 81/49 with a heart rate of 57.  She is resting during this time.  She has had blood pressures documented in the 90s systolic previously which I suspect may be her normal but I am concerned that 80s is due to her overdose.  We will  continue IV fluid boluses.  If this is not helping her improve her blood pressure, will start peripheral Levophed and discussed with ICU.  2:38 AM  Pt continues to have blood pressures in the 80s systolic despite 3 L of IV fluids.  We will start peripheral Levophed and discussed with critical care for medical admission.  2:42 AM  Spoke with Lanora ManisElizabeth NP with critical care who will admit to stepdown on levophed.  I reviewed all nursing notes and pertinent previous records as available.  I have reviewed and interpreted any EKGs, lab and urine results, imaging (as available).  ____________________________________________   FINAL CLINICAL IMPRESSION(S) / ED DIAGNOSES  Final diagnoses:  Clonidine overdose, intentional self-harm, initial encounter (HCC)  Hypotension, unspecified hypotension type     ED Discharge Orders    None      *Please note:  Margaree MackintoshBrianna S Tapscott was evaluated in Emergency Department on 06/14/2020 for the symptoms described in the history of present illness. She was evaluated in the context of the global COVID-19  pandemic, which necessitated consideration that the patient might be at risk for infection with the SARS-CoV-2 virus that causes COVID-19. Institutional protocols and algorithms that pertain to the evaluation of patients at risk for COVID-19 are in a state of rapid change based on information released by regulatory bodies including the CDC and federal and state organizations. These policies and algorithms were followed during the patient's care in the ED.  Some ED evaluations and interventions may be delayed as a result of limited staffing during and the pandemic.*   Note:  This document was prepared using Dragon voice recognition software and may include unintentional dictation errors.   Wilford Merryfield, Layla MawKristen N, DO 06/14/20 [phone]

## 2020-06-18 LAB — BLOOD GAS, ARTERIAL
Acid-base deficit: 6.2 mmol/L — ABNORMAL HIGH (ref 0.0–2.0)
Bicarbonate: 18.9 mmol/L — ABNORMAL LOW (ref 20.0–28.0)
FIO2: 21
O2 Saturation: 96.4 %
Patient temperature: 37
pCO2 arterial: 35 mmHg (ref 32.0–48.0)
pH, Arterial: 7.34 — ABNORMAL LOW (ref 7.350–7.450)
pO2, Arterial: 90 mmHg (ref 83.0–108.0)

## 2020-08-06 ENCOUNTER — Emergency Department
Admission: EM | Admit: 2020-08-06 | Discharge: 2020-08-06 | Disposition: A | Discharge status: Home / Self Care | Payer: Medicaid Other | Attending: Emergency Medicine | Admitting: Emergency Medicine

## 2020-08-06 ENCOUNTER — Other Ambulatory Visit: Payer: Self-pay

## 2020-08-06 ENCOUNTER — Encounter: Payer: Self-pay | Admitting: Emergency Medicine

## 2020-08-06 ENCOUNTER — Telehealth: Payer: Self-pay | Admitting: Obstetrics and Gynecology

## 2020-08-06 DIAGNOSIS — N920 Excessive and frequent menstruation with regular cycle: Secondary | ICD-10-CM | POA: Insufficient documentation

## 2020-08-06 DIAGNOSIS — R1030 Lower abdominal pain, unspecified: Secondary | ICD-10-CM | POA: Insufficient documentation

## 2020-08-06 DIAGNOSIS — K219 Gastro-esophageal reflux disease without esophagitis: Secondary | ICD-10-CM | POA: Insufficient documentation

## 2020-08-06 DIAGNOSIS — N939 Abnormal uterine and vaginal bleeding, unspecified: Secondary | ICD-10-CM

## 2020-08-06 LAB — CBC
HCT: 40.4 % (ref 36.0–46.0)
Hemoglobin: 13.8 g/dL (ref 12.0–15.0)
MCH: 29 pg (ref 26.0–34.0)
MCHC: 34.2 g/dL (ref 30.0–36.0)
MCV: 84.9 fL (ref 80.0–100.0)
Platelets: 205 10*3/uL (ref 150–400)
RBC: 4.76 MIL/uL (ref 3.87–5.11)
RDW: 13 % (ref 11.5–15.5)
WBC: 7.5 10*3/uL (ref 4.0–10.5)
nRBC: 0 % (ref 0.0–0.2)

## 2020-08-06 LAB — COMPREHENSIVE METABOLIC PANEL
ALT: 14 U/L (ref 0–44)
AST: 17 U/L (ref 15–41)
Albumin: 4.1 g/dL (ref 3.5–5.0)
Alkaline Phosphatase: 63 U/L (ref 38–126)
Anion gap: 6 (ref 5–15)
BUN: 9 mg/dL (ref 6–20)
CO2: 27 mmol/L (ref 22–32)
Calcium: 8.6 mg/dL — ABNORMAL LOW (ref 8.9–10.3)
Chloride: 104 mmol/L (ref 98–111)
Creatinine, Ser: 0.78 mg/dL (ref 0.44–1.00)
GFR, Estimated: >60 mL/min (ref >60)
Glucose, Bld: 87 mg/dL (ref 70–99)
Potassium: 3.9 mmol/L (ref 3.5–5.1)
Sodium: 137 mmol/L (ref 135–145)
Total Bilirubin: 0.7 mg/dL (ref 0.3–1.2)
Total Protein: 6.9 g/dL (ref 6.5–8.1)

## 2020-08-06 LAB — TYPE AND SCREEN
ABO/RH(D): O POS
Antibody Screen: NEGATIVE

## 2020-08-06 MED ORDER — OXYCODONE-ACETAMINOPHEN 5-325 MG PO TABS
1.0000 | ORAL_TABLET | Freq: Once | ORAL | Status: AC
  Administered 2020-08-06: 1 via ORAL
  Filled 2020-08-06: qty 1

## 2020-08-06 NOTE — ED Provider Notes (Signed)
[facility] Emergency Department Provider Note  Time seen: 2:44 PM  I have reviewed the triage vital signs and the nursing notes.   HISTORY  Chief Complaint Vaginal Bleeding   HPI Jaclyn Kelly is a 20 y.o. female with a past medical history of anxiety, gastric reflux, bipolar, menorrhagia, presents to the emergency department for vaginal bleeding.  According to the patient for the past year she has had significant vaginal bleeding that occurs for 7 to 10 days 3 times per month.  Follows up with Dr. Valentino Saxon.  States she is already had a D&C/D&E, is scheduled for an ablation.  Has failed hormonal therapy.  Patient states she did call Dr. Valentino Saxon today and informed her that she continues to have bleeding and significant abdominal cramping so she was referred to the emergency department for evaluation.  Patient denies any weakness dizziness or lightheadedness.  Is not sure how many tampons/pads she is using per day.   Past Medical History:  Diagnosis Date   Anxiety    Depression    GERD (gastroesophageal reflux disease)    NO MEDS   Irritable bowel syndrome (IBS) since childhood   Mental disorder    Bipolar/ Manic Depression   Pinched nerve ongoing   in foot, with inflammation   Thoracic outlet syndrome     Patient Active Problem List   Diagnosis Date Noted   Drug overdose 06/14/2020   Opiate abuse, continuous (HCC) 06/14/2020   Dysfunctional uterine bleeding 10/28/2019   Uterine mass 10/28/2019   Cesarean delivery delivered 11/22/2018   History of cesarean delivery 06/16/2018   Bipolar affective disorder in remission (HCC) 08/19/2016   Severe episode of recurrent major depressive disorder, without psychotic features (HCC)    Insomnia 06/27/2015   MDD (major depressive disorder) 06/25/2015   Moderate episode of recurrent major depressive disorder (HCC) 04/23/2015   History of trauma 04/11/2015   Miscarriage 04/11/2015   Polysubstance dependence in  early, early partial, sustained full, or sustained partial remission (HCC) 04/11/2015   Recurrent major depressive disorder, in full remission (HCC) 04/11/2015   OSA (obstructive sleep apnea) 09/19/2014   Anxiety 09/18/2014   Chronic pain of right ankle 09/18/2014   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/18/2014   Constipation 09/15/2014   Chronic tonsillitis 12/02/2013   Cervical rib 02/17/2012   Hereditary and idiopathic peripheral neuropathy 02/17/2012    Past Surgical History:  Procedure Laterality Date   CESAREAN SECTION N/A 02/09/2017   Procedure: CESAREAN SECTION;  Surgeon: Linzie Collin, MD;  Location: [facility] ORS;  Service: Obstetrics;  Laterality: N/A;   CESAREAN SECTION N/A 11/22/2018   Procedure: CESAREAN SECTION REPEAT;  Surgeon: Linzie Collin, MD;  Location: [facility] ORS;  Service: Obstetrics;  Laterality: N/A;   DILATION AND EVACUATION N/A 08/10/2019   Procedure: DILATATION AND EVACUATION;  Surgeon: Linzie Collin, MD;  Location: [facility] ORS;  Service: Gynecology;  Laterality: N/A;   EMBOLIZATION N/A 11/03/2019   Procedure: EMBOLIZATION;  Surgeon: Annice Needy, MD;  Location: [facility] INVASIVE CV LAB;  Service: Cardiovascular;  Laterality: N/A;   FIRST RIB REMOVAL     TONSILLECTOMY      Prior to Admission medications   Medication Sig Start Date End Date Taking? Authorizing Provider  buprenorphine-naloxone (SUBOXONE) 8-2 mg SUBL SL tablet Place 0.5 tablets under the tongue 2 (two) times daily. 06/14/20   Erin Fulling, MD  cetirizine (ZYRTEC) 10 MG tablet Take 10 mg by mouth daily. 05/30/20   [provider]  desvenlafaxine (PRISTIQ) 100 MG 24 hr tablet Take 2 tablets (200 mg total) by mouth daily. 06/14/20   Erin Fulling, MD  ferrous sulfate 324 MG TBEC Take 1 tablet (324 mg total) by mouth 2 (two) times daily with a meal. Patient not taking: No sig reported 12/16/19   Linzie Collin, MD  naloxone [facility]) nasal spray 4 mg/0.1 mL See admin  instructions. 05/01/20   [provider]    Allergies  Allergen Reactions   Vicodin [Hydrocodone-Acetaminophen] Itching and Nausea Only   Amoxicillin Hives and Rash    Did it involve swelling of the face/tongue/throat, SOB, or low BP? No Did it involve sudden or severe rash/hives, skin peeling, or any reaction on the inside of your mouth or nose? No Did you need to seek medical attention at a hospital or doctor's office? No When did it last happen? unknown   If all above answers are "NO", may proceed with cephalosporin use. Yeast Infection   Tramadol Nausea And Vomiting, Rash and Swelling    Family History  Problem Relation Age of Onset   Hypertension Father    Stroke Father    Heart failure Father    Healthy Mother     Social History Social History   Tobacco Use   Smoking status: Never   Smokeless tobacco: Never  Vaping Use   Vaping Use: Every day   Substances: Nicotine  Substance Use Topics   Alcohol use: No   Drug use: No    Review of Systems Constitutional: Negative for fever. Cardiovascular: Negative for chest pain. Respiratory: Negative for shortness of breath. Gastrointestinal: Moderate lower abdominal cramping. Genitourinary: Positive for vaginal bleeding. Musculoskeletal: Negative for musculoskeletal complaints Neurological: Negative for headache All other ROS negative  ____________________________________________   PHYSICAL EXAM:  VITAL SIGNS: ED Triage Vitals [08/06/20 1330]  Enc Vitals Group     BP 119/79     Pulse Rate 97     Resp 18     Temp 98.5 F (36.9 C)     Temp Source Oral     SpO2 99 %     Weight 132 lb 15 oz (60.3 kg)     Height 5\' 2"  (1.575 m)     Head Circumference      Peak Flow      Pain Score 10     Pain Loc      Pain Edu?      Excl. in GC?    Constitutional: Alert and oriented. Well appearing and in no distress. Eyes: Normal exam ENT      Head: Normocephalic and atraumatic.      Mouth/Throat: Mucous  membranes are moist. Cardiovascular: Normal rate, regular rhythm.  Respiratory: Normal respiratory effort without tachypnea nor retractions. Breath sounds are clear Gastrointestinal: Soft, mild lower abdominal tenderness palpation.  No rebound guarding or distention. Musculoskeletal: Nontender with normal range of motion in all extremities.  Neurologic:  Normal speech and language. No gross focal neurologic deficits  Skin:  Skin is warm, dry and intact.  Psychiatric: Mood and affect are normal. S  ____________________________________________   INITIAL IMPRESSION / ASSESSMENT AND PLAN / ED COURSE  Pertinent labs & imaging results that were available during my care of the patient were reviewed by me and considered in my medical decision making (see chart for details).   Patient presents to the emergency department for continued lower abdominal discomfort/cramping along with vaginal bleeding which has been ongoing for the past 1 year.  Patient states she has had a D&C/D&E, she has failed hormonal therapy and is now scheduled for an ablation with Dr. Valentino Saxon.  States she has a follow-up appointment with Dr. Valentino Saxon this Wednesday but called today due to increased cramping and continued bleeding and was referred to the emergency department.  We will check labs as a precaution to rule out anemia.  As the patient has already failed hormonal therapy and is scheduled for an ablation as long as the patient's labs are reassuring anticipate likely discharge home with medication as needed for cramping in OB/GYN follow-up.  Patient agreeable to plan of care.  Patient's lab work is reassuring including hemoglobin of 13.8 which is improved from last check.  We will have the patient follow-up with GI medicine.  Patient agreeable to plan of care.  Jaclyn Kelly was evaluated in Emergency Department on 08/06/2020 for the symptoms described in the history of present illness. She was evaluated in the context of the  global COVID-19 pandemic, which necessitated consideration that the patient might be at risk for infection with the SARS-CoV-2 virus that causes COVID-19. Institutional protocols and algorithms that pertain to the evaluation of patients at risk for COVID-19 are in a state of rapid change based on information released by regulatory bodies including the CDC and federal and state organizations. These policies and algorithms were followed during the patient's care in the ED.  ____________________________________________   FINAL CLINICAL IMPRESSION(S) / ED DIAGNOSES  lower abdominal cramping menorrhagia   Minna Antis, MD 08/06/20 1539

## 2020-08-06 NOTE — Telephone Encounter (Signed)
Pt called stating concerns about "pushing out super plus tampon every 30  min" bright red. I advised pt that neither provider was in office- I would send message but if she is bleeding bad and super worried- go to emergency room. Please Advise.

## 2020-08-06 NOTE — ED Triage Notes (Signed)
Pt comes into the ED via POV c/o vaginal bleeding.  Pt states this has been ongoing problem for about a year since she had an abortion.  PT is supposed to have an ablation done and has met with the surgeon but has not scheduled the surgery yet.  Pt unsure how many pads she soaks through in a day.  Pt denies any shob or dizziness and patient has good color.

## 2020-08-06 NOTE — ED Notes (Signed)
See triage note  Presents with some vaginal bleeding  States she has had an abortion about 1 year ago  States she had this problem since. Has seen surgeon for ablation

## 2020-08-07 NOTE — Telephone Encounter (Signed)
Patient called back at 4:33 to see if she could be seen. Not able to take any more patients today. She has an appointment scheduled for tomorrow.

## 2020-08-08 ENCOUNTER — Ambulatory Visit: Payer: Medicaid Other | Admitting: Obstetrics and Gynecology

## 2020-08-08 ENCOUNTER — Encounter: Payer: Self-pay | Admitting: Obstetrics and Gynecology

## 2020-08-08 ENCOUNTER — Other Ambulatory Visit: Payer: Self-pay

## 2020-08-08 VITALS — BP 121/85 | HR 86 | Ht 62.0 in | Wt 127.0 lb

## 2020-08-08 DIAGNOSIS — N800 Endometriosis of uterus: Secondary | ICD-10-CM

## 2020-08-08 DIAGNOSIS — N939 Abnormal uterine and vaginal bleeding, unspecified: Secondary | ICD-10-CM | POA: Diagnosis not present

## 2020-08-08 DIAGNOSIS — N8003 Adenomyosis of the uterus: Secondary | ICD-10-CM

## 2020-08-08 MED ORDER — ORILISSA 150 MG PO TABS: 1.0000 | ORAL_TABLET | Freq: Every day | ORAL | 11 refills | Status: DC

## 2020-08-08 NOTE — Progress Notes (Signed)
GYNECOLOGY PROGRESS NOTE  Subjective:    Patient ID: Jaclyn Kelly, female    DOB: 12-19-2000, 20 y.o.   MRN: [id number]  HPI  Patient is a 20 y.o. [id number] female who presents to discuss surgery. Previously seen by Dr. Brennan Bailey, MD. Patient stated having multiply cycles throughout the month. Patient stated she can have three cycles in one month; heavy bleeding with clots; using pads and tampons at the same time changing every hour with fatigue. She was seen in the ER 2 days ago in the ER due to her complaints. Was noted to have normal hemoglobin at that time and was instructed to f/u with OB/GYN.   Of note, patient's history is significant for a D&C for suspected retained products of conception after a termination of pregnancy last year. Since that time, she has had irregular heavy bleeding despite multiple treatments with hormonal therapy (OCPs, Depo Provera, IUD), in addition to surgical interventions including a repeat D&C, and IR intervention with uterine artery embolization.  Review of ultrasound and MRI noting possible focal area of adenomyosis.  Patient presents today as she was referred for discussion of possible management of bleeding with endometrial ablation.    The following portions of the patient's history were reviewed and updated as appropriate: allergies, current medications, past family history, past medical history, past social history, past surgical history, and problem list.   Review of Systems Pertinent items noted in HPI and remainder of comprehensive ROS otherwise negative.   Objective:   Blood pressure 121/85, pulse 86, height 5\' 2"  (1.575 m), weight 127 lb (57.6 kg), unknown if currently breastfeeding. Body mass index is 23.23 kg/m.  General appearance: alert, cooperative, appears stated age, fatigued, and no distress Abdomen: soft, non-tender; bowel sounds normal; no masses,  no organomegaly Pelvic: external genitalia normal, rectovaginal septum normal.   Vagina without discharge. Very scant amount of blood in vault (however patient recently removed tampon for exam).  Cervix normal appearing, no lesions and no motion tenderness.  Uterus mobile, nontender, normal shape and size.  Adnexae non-palpable, nontender bilaterally.  Extremities: extremities normal, atraumatic, no cyanosis or edema Neurologic: Grossly normal   Assessment:   1. Abnormal uterine bleeding   2. Adenomyosis     Plan:   Review of chart notes that patient has had fairly extensive workup for her bleeding, however has not had bleeding studies. Patient's mother also present at today's visit, notes a history of easy bruising.  Will order bleeding profile.  Adenomyosis - has had multiple treatments performed for her abnormal bleeding. Now considering endometrial ablation.  I had a long discussion with patient regarding this surgical option. I discussed that this is a temporizing measure, usually helps to manage cycles for an average of 7-10 years. Advised that with a diagnosis of adenomyosis, there is still a probability that her symptoms still may not respond and she will continue to have irregular bleeding. I also discussed that it is not recommended for child-bearing after having an ablation due to destruction of the uterine lining, and considering she is still of young age, she may deal with issue of regret in the future. Patient notes that she is currently in a same sex relationship and is not considering childbearing at this time. Advised that I would discuss her case with GYN Oncology to see if any other recommendations may be available, or if consideration of ablation should occur. In the meantime will attempt to manage with (similar treatment for endometriosis). Given  samples of 150 mg dosing.  No need for other contraception at this time due to current relationship status.   Patient to return in 2-3 weeks to follow up.   A total of 20 minutes were spent face-to-face  with the patient during this encounter and over half of that time dealt with counseling and coordination of care.   Hildred Laser, MD [facility]

## 2020-08-08 NOTE — Patient Instructions (Signed)
Elagolix; Estradiol; Norethindrone acetate Oral Capsules What is this medication? ELAGOLIX; ESTRADIOL; NORETHINDRONE (el a GOE lix; es tra DYE ole; nor eth INdrone) is used to treat heavy menstrual bleeding due to uterine fibroids. This medicine may be used for other purposes; ask your health care provider orpharmacist if you have questions. COMMON BRAND NAME(S): ORIAHNN What should I tell my care team before I take this medication? They need to know if you have any of these conditions: blood vessel disease or blood clots breast, cervical, endometrial, or uterine cancer cigarette smoker diabetes (high blood sugar) gallbladder disease heart disease high blood pressure high cholesterol kidney disease liver disease lupus migraine headaches mental health disease osteoporosis, weak bones porphyria stroke suicidal thoughts, plans, or attempt; a previous suicide attempt by you or a family member unexplained vaginal bleeding an unusual or allergic reaction to elagolix, estrogens, progestins, other medicines, foods, dyes, or preservatives pregnant or trying to get pregnant breast-feeding How should I use this medication? Take this medicine by mouth with water. You can take it with or without food. Take it as directed on the prescription label at the same time every day. Keeptaking it unless your health care provider tells you to stop. A special MedGuide will be given to you by the pharmacist with eachprescription and refill. Be sure to read this information carefully each time. Talk to your health care provider about the use of this medicine in children.It is not approved for use in children. Overdosage: If you think you have taken too much of this medicine contact apoison control center or emergency room at once. NOTE: This medicine is only for you. Do not share this medicine with others. What if I miss a dose? If you miss a dose, take it within 4 hours of the time that it was supposed to  be taken and take the next dose at the usual time. If more than 4 hours have passed, skip the missed dose. The next dose should be taken at the usual time.Do not take double or extra doses. What may interact with this medication? Do not take this medicine with any of the following medications: aromatase inhibitors like aminoglutethimide, anastrozole, exemestane, letrozole, testolactone cyclosporine enasidenib gemfibrozil This medicine may also interact with the following medications: benzodiazepines bosentan bromocriptine certain antivirals for HIV or hepatitis certain medicines for seizures like carbamazepine, phenobarbital, phenytoin cimetidine citalopram dantrolene digoxin grapefruit juice griseofulvin hydrocortisone, cortisone, or prednisolone isoniazid (INH) medicines for diabetes methadone methotrexate midazolam mineral oil omeprazole other female hormones, like estrogens or progestins and birth control pills, patches, rings, or injections raloxifene rifabutin, rifampin, or rifapentine rosuvastatin tamoxifen thyroid hormones topiramate tricyclic antidepressants warfarin This list may not describe all possible interactions. Give your health care provider a list of all the medicines, herbs, non-prescription drugs, or dietary supplements you use. Also tell them if you smoke, drink alcohol, or use illegaldrugs. Some items may interact with your medicine. What should I watch for while using this medication? Visit your health care provider for regular checks on your progress. You will need a regular breast and pelvic exam while on this medicine. It may takeseveral cycles of use to see improvement in your condition. You may have a change in bleeding pattern or irregular periods. Many femalesstop having periods while taking this drug. Smoking increases the risk of getting a blood clot or having a stroke while you are taking this medicine, especially if you are more than 21 years  old. You arestrongly advised not to smoke. This  medicine can make your body retain fluid, making your fingers, hands, or ankles swell. Your blood pressure can go up. Contact your doctor or health careprofessional if you feel you are retaining fluid. This medicine may cause weak bones (osteoporosis). Only use this product for the amount of time your health care professional tells you to. The longer you use this product the more likely you will be at risk for weak bones. Ask yourhealth care professional how you can keep strong bones. This medicine does not prevent pregnancy. Women must use effective birth control with this medicine. Use a non-hormonal form of birth control while taking this medicine and for 28 days after stopping it. Talk to your health care professional about how to prevent pregnancy. Do not become pregnant while taking this medicine. Women should inform their doctor if they wish to become pregnant or think they might be pregnant. There is a potential for serious side effects to an unborn child. Talk to your health care professional or pharmacistfor more information. If you are going to need surgery or other procedure, tell your health careprovider that you are using this medicine. If you wear contact lenses and notice visual changes, or if the lenses begin tofeel uncomfortable, consult your eye care specialist. This medicine does not protect you against HIV infection (AIDS) or any othersexually transmitted diseases. What side effects may I notice from receiving this medication? Side effects that you should report to your doctor or health care professionalas soon as possible: allergic reactions (skin rash, itching or hives, swelling of the face, lips, or tongue) anxious blood clot (chest pain, shortness of breath, pain, swelling, or warmth in the leg) breast tissue changes or discharge depressed mood increase in blood pressure light-colored stool liver injury (dark yellow or brown  urine; general ill feeling or flu-like symptoms; loss of appetite, right upper belly pain; unusually weak or tired, yellowing of the eyes or skin) stroke (changes in vision, confusion, trouble speaking or understanding, severe headaches, sudden numbness or weakness of the face, arm or leg, trouble walking, dizziness, loss of balance or coordination) suicidal thoughts, mood changes swelling of the ankles, feet, hands trouble breathing unusual vaginal bleeding Side effects that usually do not require medical attention (report these toyour doctor or health care professional if they continue or are bothersome): hair loss headache hot flashes or night sweats missed menstrual periods nausea tiredness This list may not describe all possible side effects. Call your doctor for medical advice about side effects. You may report side effects to FDA at1-800-FDA-1088. Where should I keep my medication? Keep out of the reach of children and pets. Store at room temperature between 20 and 25 degrees C (68 and 77 degrees F).Get rid of any unused medicine after the expiration date. To get rid of medicines that are no longer needed or have expired: Take the medicine to a medicine take-back program. Check with your pharmacy or law enforcement to find a location. If you cannot return the medicine, check the label or package insert to see if the medicine should be thrown out in the garbage or flushed down the toilet. If you are not sure, ask your health care provider. If it is safe to put it in the trash, take the medicine out of the container. Mix the medicine with cat litter, dirt, coffee grounds, or other unwanted substance. Seal the mixture in a bag or container. Put it in the trash. NOTE: This sheet is a summary. It may not cover all possible  information. If you have questions about this medicine, talk to your doctor, pharmacist, orhealth care provider.  2022 Elsevier/Gold Standard (2019-08-17 12:48:36)

## 2020-08-10 LAB — VON WILLEBRAND PANEL
Factor VIII Activity: 104 % (ref 56–140)
Von Willebrand Ag: 89 % (ref 50–200)
Von Willebrand Factor: 82 % (ref 50–200)

## 2020-08-10 LAB — APTT: aPTT: 26 s (ref 24–33)

## 2020-08-10 LAB — COAG STUDIES INTERP REPORT

## 2020-08-10 LAB — PROTIME-INR
INR: 1 (ref 0.9–1.2)
Prothrombin Time: 10.4 s (ref 9.1–12.0)

## 2020-08-11 ENCOUNTER — Encounter: Payer: Self-pay | Admitting: Obstetrics and Gynecology

## 2020-08-11 DIAGNOSIS — N800 Endometriosis of uterus: Secondary | ICD-10-CM | POA: Insufficient documentation

## 2020-08-11 DIAGNOSIS — N8003 Adenomyosis of the uterus: Secondary | ICD-10-CM | POA: Insufficient documentation

## 2020-08-29 ENCOUNTER — Other Ambulatory Visit: Payer: Self-pay

## 2020-08-29 ENCOUNTER — Encounter: Payer: Self-pay | Admitting: Obstetrics and Gynecology

## 2020-08-29 ENCOUNTER — Ambulatory Visit (INDEPENDENT_AMBULATORY_CARE_PROVIDER_SITE_OTHER): Payer: Medicaid Other | Admitting: Obstetrics and Gynecology

## 2020-08-29 VITALS — BP 114/79 | HR 116 | Ht 62.0 in | Wt 130.5 lb

## 2020-08-29 DIAGNOSIS — N926 Irregular menstruation, unspecified: Secondary | ICD-10-CM | POA: Diagnosis not present

## 2020-08-29 DIAGNOSIS — N800 Endometriosis of uterus: Secondary | ICD-10-CM

## 2020-08-29 DIAGNOSIS — Z3202 Encounter for pregnancy test, result negative: Secondary | ICD-10-CM | POA: Diagnosis not present

## 2020-08-29 DIAGNOSIS — N8003 Adenomyosis of the uterus: Secondary | ICD-10-CM

## 2020-08-29 LAB — POCT URINE PREGNANCY: Preg Test, Ur: NEGATIVE

## 2020-08-29 NOTE — Progress Notes (Signed)
   GYNECOLOGY CLINIC PROGRESS NOTE Subjective:    Jaclyn Kelly is a 19 y.o. [phone] female who presents for evaluation of menstrual irregularity. She believes she could be pregnant. Pregnancy is not desired. Sexual Activity: multiple partners, contraception: none. Was in relationship with same-sex partner, however more recently began having encounters with her FOB. Current symptoms also include: positive home pregnancy test. Last period was abnormal.  Was recently initiated on Orilissa for suspected adenomyosis (noted on MRI) ~ 2 weeks ago. Denies any side effects from the medication.    No LMP recorded (lmp unknown). The following portions of the patient's history were reviewed and updated as appropriate: allergies, current medications, past family history, past medical history, past social history, past surgical history, and problem list.  Review of Systems Pertinent items noted in HPI and remainder of comprehensive ROS otherwise negative.     Objective:    BP 114/79 (BP Location: Left Arm, Patient Position: Sitting, Cuff Size: Normal)   Pulse (!) 116   Ht 5\' 2"  (1.575 m)   Wt 130 lb 8 oz (59.2 kg)   LMP  (LMP Unknown)   BMI 23.87 kg/m  General: alert, cooperative, appears stated age, no distress, and no acute distress    Lab Review Urine HCG: negative    Assessment:   - Absence of menstruation.   - Adenomyosis  Plan:   - Pregnancy Test: Negative: Patient reassured but still desires blood test. Will order. Contraceptive counseling done. Planned contraceptive method: IUD. Patient will call to schedule appointment.  - Will f/u in 3 months for f/u of Orilissa medication.     , MD [facility]

## 2020-08-29 NOTE — Patient Instructions (Signed)

## 2020-08-30 LAB — HUMAN CHORIONIC GONADOTROPIN(HCG),B-SUBUNIT,QUANTITATIVE): HCG, Beta Chain, Quant, S: <1 m[IU]/mL

## 2020-09-06 ENCOUNTER — Encounter: Payer: Medicaid Other | Admitting: Obstetrics and Gynecology

## 2020-09-06 ENCOUNTER — Encounter: Payer: Self-pay | Admitting: Obstetrics and Gynecology

## 2020-09-21 ENCOUNTER — Other Ambulatory Visit: Payer: Self-pay

## 2020-09-21 ENCOUNTER — Emergency Department
Admission: EM | Admit: 2020-09-21 | Discharge: 2020-09-21 | Disposition: A | Discharge status: Home / Self Care | Payer: Medicaid Other | Attending: Emergency Medicine | Admitting: Emergency Medicine

## 2020-09-21 ENCOUNTER — Emergency Department: Payer: Medicaid Other

## 2020-09-21 DIAGNOSIS — O26891 Other specified pregnancy related conditions, first trimester: Secondary | ICD-10-CM | POA: Insufficient documentation

## 2020-09-21 DIAGNOSIS — Z3491 Encounter for supervision of normal pregnancy, unspecified, first trimester: Secondary | ICD-10-CM

## 2020-09-21 DIAGNOSIS — R110 Nausea: Secondary | ICD-10-CM | POA: Diagnosis not present

## 2020-09-21 DIAGNOSIS — R1084 Generalized abdominal pain: Secondary | ICD-10-CM

## 2020-09-21 DIAGNOSIS — Z3A01 Less than 8 weeks gestation of pregnancy: Secondary | ICD-10-CM | POA: Insufficient documentation

## 2020-09-21 DIAGNOSIS — R000 Tachycardia, unspecified: Secondary | ICD-10-CM | POA: Diagnosis not present

## 2020-09-21 DIAGNOSIS — O0091 Unspecified ectopic pregnancy with intrauterine pregnancy: Secondary | ICD-10-CM | POA: Insufficient documentation

## 2020-09-21 DIAGNOSIS — R8271 Bacteriuria: Secondary | ICD-10-CM

## 2020-09-21 DIAGNOSIS — R1030 Lower abdominal pain, unspecified: Secondary | ICD-10-CM

## 2020-09-21 DIAGNOSIS — Z8759 Personal history of other complications of pregnancy, childbirth and the puerperium: Secondary | ICD-10-CM | POA: Diagnosis not present

## 2020-09-21 LAB — COMPREHENSIVE METABOLIC PANEL
ALT: 18 U/L (ref 0–44)
AST: 17 U/L (ref 15–41)
Albumin: 4 g/dL (ref 3.5–5.0)
Alkaline Phosphatase: 66 U/L (ref 38–126)
Anion gap: 5 (ref 5–15)
BUN: 9 mg/dL (ref 6–20)
CO2: 27 mmol/L (ref 22–32)
Calcium: 9.2 mg/dL (ref 8.9–10.3)
Chloride: 105 mmol/L (ref 98–111)
Creatinine, Ser: 0.63 mg/dL (ref 0.44–1.00)
GFR, Estimated: >60 mL/min (ref >60)
Glucose, Bld: 81 mg/dL (ref 70–99)
Potassium: 3.9 mmol/L (ref 3.5–5.1)
Sodium: 137 mmol/L (ref 135–145)
Total Bilirubin: 0.5 mg/dL (ref 0.3–1.2)
Total Protein: 6.9 g/dL (ref 6.5–8.1)

## 2020-09-21 LAB — URINALYSIS, COMPLETE (UACMP) WITH MICROSCOPIC
Bilirubin Urine: NEGATIVE
Glucose, UA: NEGATIVE mg/dL
Leukocytes,Ua: NEGATIVE
Nitrite: NEGATIVE
Protein, ur: NEGATIVE mg/dL
Specific Gravity, Urine: >1.03 — ABNORMAL HIGH (ref 1.005–1.030)
pH: 6.5 (ref 5.0–8.0)

## 2020-09-21 LAB — CBC
HCT: 39.1 % (ref 36.0–46.0)
Hemoglobin: 13.7 g/dL (ref 12.0–15.0)
MCH: 29.3 pg (ref 26.0–34.0)
MCHC: 35 g/dL (ref 30.0–36.0)
MCV: 83.7 fL (ref 80.0–100.0)
Platelets: 227 10*3/uL (ref 150–400)
RBC: 4.67 MIL/uL (ref 3.87–5.11)
RDW: 13.7 % (ref 11.5–15.5)
WBC: 9 10*3/uL (ref 4.0–10.5)
nRBC: 0 % (ref 0.0–0.2)

## 2020-09-21 LAB — HCG, QUANTITATIVE, PREGNANCY: hCG, Beta Chain, Quant, S: 474 m[IU]/mL — ABNORMAL HIGH (ref <5)

## 2020-09-21 LAB — LIPASE, BLOOD: Lipase: 31 U/L (ref 11–51)

## 2020-09-21 LAB — POC URINE PREG, ED: Preg Test, Ur: POSITIVE — AB

## 2020-09-21 MED ORDER — VITAMIN B-6 50 MG PO TABS
50.0000 mg | ORAL_TABLET | ORAL | Status: AC
  Administered 2020-09-21: 50 mg via ORAL
  Filled 2020-09-21: qty 1

## 2020-09-21 MED ORDER — FOSFOMYCIN TROMETHAMINE 3 G PO PACK
3.0000 g | PACK | Freq: Once | ORAL | Status: AC
  Administered 2020-09-21: 3 g via ORAL
  Filled 2020-09-21: qty 3

## 2020-09-21 MED ORDER — DICYCLOMINE HCL 10 MG PO CAPS
10.0000 mg | ORAL_CAPSULE | Freq: Once | ORAL | Status: AC
  Administered 2020-09-21: 10 mg via ORAL
  Filled 2020-09-21: qty 1

## 2020-09-21 MED ORDER — DOXYLAMINE-PYRIDOXINE 10-10 MG PO TBEC: 1.0000 | DELAYED_RELEASE_TABLET | Freq: Two times a day (BID) | ORAL | 0 refills | Status: DC | PRN

## 2020-09-21 NOTE — ED Provider Notes (Signed)
[facility] Emergency Department Provider Note  ____________________________________________   Event Date/Time   First MD Initiated Contact with Patient 09/21/20 1043     (approximate)  I have reviewed the triage vital signs and the nursing notes.   HISTORY  Chief Complaint Abdominal Pain   HPI Jaclyn Kelly is a 20 y.o. female with a past medical history of anxiety, depression, IBS, GERD, OSA, substance abuse, and incomplete abortion status post D&C on 08/10/2019 as well as a embolization for dysfunctional uterine bleeding on 10/21 who presents for assessment of approximate 2 days of some generalized abdominal cramping in setting of recent home positive pregnancy test about 1 week ago.  Patient states her last menstrual period was approximately 3 days ago.  She states she is not currently trying to get pregnant.  She denies any abnormal vaginal bleeding, discharge, vomiting, diarrhea but does endorse some nausea.  She denies any chest pain, headache, sore throat, cough, shortness of breath or any other clear associated sick symptoms.  No other acute concerns at this time.  She did take some Tylenol prior to arrival.         Past Medical History:  Diagnosis Date   Anxiety    Depression    GERD (gastroesophageal reflux disease)    NO MEDS   Irritable bowel syndrome (IBS) since childhood   Mental disorder    Bipolar/ Manic Depression   Pinched nerve ongoing   in foot, with inflammation   Thoracic outlet syndrome     Patient Active Problem List   Diagnosis Date Noted   Adenomyosis 08/11/2020   Drug overdose 06/14/2020   Opiate abuse, continuous (HCC) 06/14/2020   Dysfunctional uterine bleeding 10/28/2019   Uterine mass 10/28/2019   Cesarean delivery delivered 11/22/2018   History of cesarean delivery 06/16/2018   Bipolar affective disorder in remission (HCC) 08/19/2016   Severe episode of recurrent major depressive disorder, without psychotic  features (HCC)    Insomnia 06/27/2015   MDD (major depressive disorder) 06/25/2015   Moderate episode of recurrent major depressive disorder (HCC) 04/23/2015   History of trauma 04/11/2015   Miscarriage 04/11/2015   Polysubstance dependence in early, early partial, sustained full, or sustained partial remission (HCC) 04/11/2015   Recurrent major depressive disorder, in full remission (HCC) 04/11/2015   OSA (obstructive sleep apnea) 09/19/2014   Anxiety 09/18/2014   Chronic pain of right ankle 09/18/2014   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/18/2014   Constipation 09/15/2014   Chronic tonsillitis 12/02/2013   Cervical rib 02/17/2012   Hereditary and idiopathic peripheral neuropathy 02/17/2012    Past Surgical History:  Procedure Laterality Date   CESAREAN SECTION N/A 02/09/2017   Procedure: CESAREAN SECTION;  Surgeon: Linzie Collin, MD;  Location: [facility] ORS;  Service: Obstetrics;  Laterality: N/A;   CESAREAN SECTION N/A 11/22/2018   Procedure: CESAREAN SECTION REPEAT;  Surgeon: Linzie Collin, MD;  Location: [facility] ORS;  Service: Obstetrics;  Laterality: N/A;   DILATION AND EVACUATION N/A 08/10/2019   Procedure: DILATATION AND EVACUATION;  Surgeon: Linzie Collin, MD;  Location: [facility] ORS;  Service: Gynecology;  Laterality: N/A;   EMBOLIZATION N/A 11/03/2019   Procedure: EMBOLIZATION;  Surgeon: Annice Needy, MD;  Location: [facility] INVASIVE CV LAB;  Service: Cardiovascular;  Laterality: N/A;   FIRST RIB REMOVAL     TONSILLECTOMY      Prior to Admission medications   Medication Sig Start Date End Date Taking? Authorizing Provider  desvenlafaxine (  PRISTIQ) 100 MG 24 hr tablet Take 2 tablets (200 mg total) by mouth daily. 06/14/20   Erin Fulling, MD  Elagolix Sodium (ORILISSA) 150 MG TABS Take 1 tablet by mouth daily. 08/08/20   Hildred Laser, MD  naloxone [facility]) nasal spray 4 mg/0.1 mL See admin instructions. 05/01/20   [provider]     Allergies Vicodin [hydrocodone-acetaminophen], Amoxicillin, and Tramadol  Family History  Problem Relation Age of Onset   Hypertension Father    Stroke Father    Heart failure Father    Healthy Mother     Social History Social History   Tobacco Use   Smoking status: Never   Smokeless tobacco: Never  Vaping Use   Vaping Use: Every day   Substances: Nicotine, Flavoring  Substance Use Topics   Alcohol use: No   Drug use: No    Review of Systems  Review of Systems  Constitutional:  Negative for chills and fever.  HENT:  Negative for sore throat.   Eyes:  Negative for pain.  Respiratory:  Negative for cough and stridor.   Cardiovascular:  Negative for chest pain.  Gastrointestinal:  Positive for abdominal pain and nausea. Negative for vomiting.  Genitourinary:  Negative for dysuria.  Musculoskeletal:  Negative for myalgias.  Skin:  Negative for rash.  Neurological:  Negative for seizures, loss of consciousness and headaches.  Psychiatric/Behavioral:  Negative for suicidal ideas.   All other systems reviewed and are negative.    ____________________________________________   PHYSICAL EXAM:  VITAL SIGNS: ED Triage Vitals  Enc Vitals Group     BP 09/21/20 1024 122/71     Pulse Rate 09/21/20 1024 (!) 109     Resp 09/21/20 1024 18     Temp 09/21/20 1024 99 F (37.2 C)     Temp src --      SpO2 09/21/20 1024 99 %     Weight --      Height --      Head Circumference --      Peak Flow --      Pain Score 09/21/20 1028 7     Pain Loc --      Pain Edu? --      Excl. in GC? --    Vitals:   09/21/20 1024  BP: 122/71  Pulse: (!) 109  Resp: 18  Temp: 99 F (37.2 C)  SpO2: 99%   Physical Exam Vitals and nursing note reviewed.  Constitutional:      General: She is not in acute distress.    Appearance: She is well-developed.  HENT:     Head: Normocephalic and atraumatic.     Right Ear: External ear normal.     Left Ear: External ear normal.     Nose:  Nose normal.     Mouth/Throat:     Mouth: Mucous membranes are moist.  Eyes:     Conjunctiva/sclera: Conjunctivae normal.  Cardiovascular:     Rate and Rhythm: Normal rate and regular rhythm.     Heart sounds: No murmur heard. Pulmonary:     Effort: Pulmonary effort is normal. No respiratory distress.     Breath sounds: Normal breath sounds.  Abdominal:     Palpations: Abdomen is soft.     Tenderness: There is no abdominal tenderness. There is no right CVA tenderness or left CVA tenderness.  Musculoskeletal:     Cervical back: Neck supple.  Skin:    General: Skin is warm and dry.  Capillary Refill: Capillary refill takes less than 2 seconds.  Neurological:     Mental Status: She is alert and oriented to person, place, and time.  Psychiatric:        Mood and Affect: Mood normal.     ____________________________________________   LABS (all labs ordered are listed, but only abnormal results are displayed)  Labs Reviewed  URINALYSIS, COMPLETE (UACMP) WITH MICROSCOPIC - Abnormal; Notable for the following components:      Result Value   APPearance HAZY (*)    Specific Gravity, Urine >1.030 (*)    Hgb urine dipstick TRACE (*)    Ketones, ur TRACE (*)    Bacteria, UA FEW (*)    All other components within normal limits  HCG, QUANTITATIVE, PREGNANCY - Abnormal; Notable for the following components:   hCG, Beta Chain, Quant, S 474 (*)    All other components within normal limits  POC URINE PREG, ED - Abnormal; Notable for the following components:   Preg Test, Ur Positive (*)    All other components within normal limits  URINE CULTURE  LIPASE, BLOOD  COMPREHENSIVE METABOLIC PANEL  CBC   ____________________________________________  EKG  ____________________________________________  RADIOLOGY  ED MD interpretation: Pelvic ultrasound shows visualized intrauterine embryo without yolk sac being visualized or cardiac cavity.  There is no visualized subchorionic  hemorrhage and ovaries and adnexa are unremarkable.  No free fluid.  Official radiology report(s): US OB LESS THAN 14 WEEKS WITH OB TRANSVAGINAL  Result Date: 09/21/2020 CLINICAL DATA:  Lower abdominal pain.  First trimester of pregnancy. EXAM: OBSTETRIC <14 WK US AND TRANSVAGINAL OB US TECHNIQUE: Both transabdominal and transvaginal ultrasound examinations were performed for complete evaluation of the gestation as well as the maternal uterus, adnexal regions, and pelvic cul-de-sac. Transvaginal technique was performed to assess early pregnancy. COMPARISON:  February 08, 2020. FINDINGS: Intrauterine gestational sac: Single Yolk sac:  Not Visualized. Embryo:  Visualized. Cardiac Activity: Not Visualized. CRL:  4.7 mm   6 w   1 d                  US EDC: May 16, 2021. Subchorionic hemorrhage:  None visualized. Maternal uterus/adnexae: Ovaries are unremarkable. No free fluid is noted. IMPRESSION: Findings are suspicious but not yet definitive for failed pregnancy. Recommend follow-up US in 10-14 days for definitive diagnosis. This recommendation follows SRU consensus guidelines: Diagnostic Criteria for Nonviable Pregnancy Early in the First Trimester. Malva Limes Engl J Med 2013; [id number]; 369:1443-51. Electronically Signed   By: Lupita RaiderJames  Green Jr M.D.   On: 09/21/2020 12:32    ____________________________________________   PROCEDURES  Procedure(s) performed (including Critical Care):  Procedures   ____________________________________________   INITIAL IMPRESSION / ASSESSMENT AND PLAN / ED COURSE        Patient presents with above-stated history and exam for assessment of approximately 1 or 2 days of some crampy abdominal pain associate with nausea.  She states she took a home pregnancy test that was positive a week ago and her last LP was about 30 days ago.  She is not currently trying to get pregnant.  On arrival she is afebrile and slightly tachycardic with otherwise stable vital signs on room air.  Her abdomen is  soft nontender throughout she has no CVA tenderness.  Differential includes possible ectopic pregnancy, symptomatic early pregnancy, cystitis, and gastritis.  She has no CVA tenderness fever or other factors at this time suggest acute appendicitis elicitation for cholecystitis or kidney stone.  Pelvic ultrasound shows  visualized intrauterine embryo without yolk sac being visualized or cardiac cavity.  There is no visualized subchorionic hemorrhage and ovaries and adnexa are unremarkable.  No free fluid.  CMP shows no significant electrolyte metabolic derangements.  No evidence of hepatitis or cholestasis.  Lipase not consistent with pancreatitis.  CBC without leukocytosis or acute anemia.  hCG is 474.  UA has a few bacteria trace blood and ketones but no other convincing evidence of cystitis.  Impression is likely very early pregnancy with possible failed interim pregnancy.  No indications of ectopic at this time.  Low suspicion for TOA torsion or PID at this time crampiness is fairly generalized and not acid to the pelvic region and patient denies any abnormal vaginal bleeding or discharge, or burning with urination.  Advised patient that she will need repeat ultrasound early next week ideally in 40 to 70 hours to determine viability of her current pregnancy she localized in the upper pregnancy, rechecked as it was very low today it is too early to completely rule out possible very early ectopic as well.  I do feel like this is less likely given adnexa completely normal ultrasound.  I think she is stable for discharge with close outpatient OB/GYN follow-up.  She is amenable to plan.  We will treat her bacteriuria with a dose of p.o. fosfomycin..  We will also prescribe Diclegis.  Discharged stable condition.  Strict return precautions advised and discussed.     ____________________________________________   FINAL CLINICAL IMPRESSION(S) / ED DIAGNOSES  Final diagnoses:  Lower abdominal pain  Hx  of ectopic pregnancy    Medications  dicyclomine (BENTYL) capsule 10 mg (10 mg Oral Given 09/21/20 1119)  pyridOXINE (VITAMIN B-6) tablet 50 mg (50 mg Oral Given 09/21/20 1119)     ED Discharge Orders     None        Note:  This document was prepared using Dragon voice recognition software and may include unintentional dictation errors.    Gilles Chiquito, MD 09/21/20 1255

## 2020-09-21 NOTE — ED Triage Notes (Signed)
Pt comes with c/o abdominal pain and cramping. Pt states 6+ preg tests at home. Pt states no bleeding.

## 2020-09-22 LAB — URINE CULTURE: Culture: NO GROWTH

## 2020-09-24 ENCOUNTER — Other Ambulatory Visit: Payer: Self-pay

## 2020-09-24 ENCOUNTER — Other Ambulatory Visit: Payer: Medicaid Other

## 2020-09-24 DIAGNOSIS — O039 Complete or unspecified spontaneous abortion without complication: Secondary | ICD-10-CM

## 2020-09-25 ENCOUNTER — Other Ambulatory Visit: Payer: Self-pay

## 2020-09-25 DIAGNOSIS — N926 Irregular menstruation, unspecified: Secondary | ICD-10-CM

## 2020-09-25 LAB — BETA HCG QUANT (REF LAB): hCG Quant: 590 m[IU]/mL

## 2020-10-01 ENCOUNTER — Other Ambulatory Visit: Payer: Medicaid Other

## 2020-10-01 ENCOUNTER — Encounter: Payer: Self-pay | Admitting: Obstetrics and Gynecology

## 2020-10-09 ENCOUNTER — Other Ambulatory Visit: Payer: Self-pay

## 2020-10-09 ENCOUNTER — Encounter: Payer: Self-pay | Admitting: Obstetrics and Gynecology

## 2020-10-09 ENCOUNTER — Ambulatory Visit (INDEPENDENT_AMBULATORY_CARE_PROVIDER_SITE_OTHER): Payer: Medicaid Other | Admitting: Obstetrics and Gynecology

## 2020-10-09 VITALS — BP 120/76 | HR 114 | Resp 16 | Ht 62.0 in | Wt 138.3 lb

## 2020-10-09 DIAGNOSIS — Z3200 Encounter for pregnancy test, result unknown: Secondary | ICD-10-CM | POA: Diagnosis not present

## 2020-10-09 LAB — POCT URINE PREGNANCY: Preg Test, Ur: POSITIVE — AB

## 2020-10-09 NOTE — Progress Notes (Signed)
HPI:      Jaclyn Kelly is a 20 y.o. [phone] who LMP was No LMP recorded (lmp unknown). Patient is pregnant.  Subjective:   She presents today as a follow-up from the emergency department.  She presented there with pelvic cramping.  She states that this has mostly resolved.  She denies vaginal bleeding.  She does have nausea without vomiting.  Pregnancy test is positive. An ultrasound performed in the emergency department showed a 6-week size sac without fetal pole. She is taking prenatal vitamins. This was not an intended pregnancy.    Hx: The following portions of the patient's history were reviewed and updated as appropriate:             She  has a past medical history of Anxiety, Depression, GERD (gastroesophageal reflux disease), Irritable bowel syndrome (IBS) (since childhood), Mental disorder, Pinched nerve (ongoing), and Thoracic outlet syndrome. She does not have any pertinent problems on file. She  has a past surgical history that includes First rib removal; Tonsillectomy; Cesarean section (N/A, 02/09/2017); Cesarean section (N/A, 11/22/2018); Dilation and evacuation (N/A, 08/10/2019); and EMBOLIZATION (N/A, 11/03/2019). Her family history includes Healthy in her mother; Heart failure in her father; Hypertension in her father; Stroke in her father. She  reports that she has never smoked. She has never used smokeless tobacco. She reports that she does not drink alcohol and does not use drugs. She has a current medication list which includes the following prescription(s): desvenlafaxine. She is allergic to vicodin [hydrocodone-acetaminophen], amoxicillin, and tramadol.       Review of Systems:  Review of Systems  Constitutional: Denied constitutional symptoms, night sweats, recent illness, fatigue, fever, insomnia and weight loss.  Eyes: Denied eye symptoms, eye pain, photophobia, vision change and visual disturbance.  Ears/Nose/Throat/Neck: Denied ear, nose, throat or neck  symptoms, hearing loss, nasal discharge, sinus congestion and sore throat.  Cardiovascular: Denied cardiovascular symptoms, arrhythmia, chest pain/pressure, edema, exercise intolerance, orthopnea and palpitations.  Respiratory: Denied pulmonary symptoms, asthma, pleuritic pain, productive sputum, cough, dyspnea and wheezing.  Gastrointestinal: Denied, gastro-esophageal reflux, melena, nausea and vomiting.  Genitourinary: Denied genitourinary symptoms including symptomatic vaginal discharge, pelvic relaxation issues, and urinary complaints.  Musculoskeletal: Denied musculoskeletal symptoms, stiffness, swelling, muscle weakness and myalgia.  Dermatologic: Denied dermatology symptoms, rash and scar.  Neurologic: Denied neurology symptoms, dizziness, headache, neck pain and syncope.  Psychiatric: Denied psychiatric symptoms, anxiety and depression.  Endocrine: Denied endocrine symptoms including hot flashes and night sweats.   Meds:   Current Outpatient Medications on File Prior to Visit  Medication Sig Dispense Refill   desvenlafaxine (PRISTIQ) 100 MG 24 hr tablet Take 2 tablets (200 mg total) by mouth daily. 30 tablet 3   No current facility-administered medications on file prior to visit.      Objective:     Vitals:   10/09/20 1050  BP: 120/76  Pulse: (!) 114  Resp: 16   Filed Weights   10/09/20 1050  Weight: 138 lb 4.8 oz (62.7 kg)                        Assessment:    [id number] Patient Active Problem List   Diagnosis Date Noted   Adenomyosis 08/11/2020   Drug overdose 06/14/2020   Opiate abuse, continuous (HCC) 06/14/2020   Dysfunctional uterine bleeding 10/28/2019   Uterine mass 10/28/2019   Cesarean delivery delivered 11/22/2018   History of cesarean delivery 06/16/2018   Bipolar affective disorder in remission (  HCC) 08/19/2016   Severe episode of recurrent major depressive disorder, without psychotic features (HCC)    Insomnia 06/27/2015   MDD (major depressive  disorder) 06/25/2015   Moderate episode of recurrent major depressive disorder (HCC) 04/23/2015   History of trauma 04/11/2015   Miscarriage 04/11/2015   Polysubstance dependence in early, early partial, sustained full, or sustained partial remission (HCC) 04/11/2015   Recurrent major depressive disorder, in full remission (HCC) 04/11/2015   OSA (obstructive sleep apnea) 09/19/2014   Anxiety 09/18/2014   Chronic pain of right ankle 09/18/2014   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 Possible pregnancy, not yet confirmed     Patient with empty sac in the uterus.  Needs further evaluation to determine fetal pole versus blighted ovum.   Plan:            1.  Ultrasound  2.  Follow-up 1 week after ultrasound to discuss blighted ovum versus normal first trimester pregnancy.  3.  History of uterine AV malformation discussed. Orders Orders Placed This Encounter  Procedures   US OB Comp Less 14 Wks   POCT urine pregnancy    No orders of the defined types were placed in this encounter.     F/U  Return in about 2 weeks (around 10/23/2020). I spent 21 minutes involved in the care of this patient preparing to see the patient by obtaining and reviewing her medical history (including labs, imaging tests and prior procedures), documenting clinical information in the electronic health record (EHR), counseling and coordinating care plans, writing and sending prescriptions, ordering tests or procedures and in direct communicating with the patient and medical staff discussing pertinent items from her history and physical exam.  Elonda Husky, M.D. 10/09/2020 11:04 AM

## 2020-10-17 ENCOUNTER — Ambulatory Visit (INDEPENDENT_AMBULATORY_CARE_PROVIDER_SITE_OTHER): Payer: Medicaid Other

## 2020-10-17 ENCOUNTER — Other Ambulatory Visit: Payer: Medicaid Other

## 2020-10-17 ENCOUNTER — Other Ambulatory Visit: Payer: Self-pay

## 2020-10-17 ENCOUNTER — Other Ambulatory Visit: Payer: Self-pay | Admitting: Obstetrics and Gynecology

## 2020-10-17 DIAGNOSIS — N926 Irregular menstruation, unspecified: Secondary | ICD-10-CM

## 2020-10-17 DIAGNOSIS — Z3200 Encounter for pregnancy test, result unknown: Secondary | ICD-10-CM | POA: Diagnosis not present

## 2020-10-18 LAB — BETA HCG QUANT (REF LAB): hCG Quant: 12064 m[IU]/mL

## 2020-10-19 ENCOUNTER — Telehealth: Payer: Self-pay | Admitting: Obstetrics and Gynecology

## 2020-10-19 NOTE — Telephone Encounter (Signed)
Pt aware u/s has not been signed by provider.   Will send message to DJE.

## 2020-10-19 NOTE — Telephone Encounter (Signed)
Pt called asking for a call back "today" about ultrasound, said that she "wants to know if she is carrying a dead baby or not" Please Advise.

## 2020-10-22 ENCOUNTER — Telehealth: Payer: Self-pay | Admitting: Obstetrics and Gynecology

## 2020-10-22 DIAGNOSIS — O020 Blighted ovum and nonhydatidiform mole: Secondary | ICD-10-CM

## 2020-10-22 MED ORDER — KETOROLAC TROMETHAMINE 10 MG PO TABS: 10.0000 mg | ORAL_TABLET | Freq: Four times a day (QID) | ORAL | 0 refills | Status: DC | PRN

## 2020-10-22 NOTE — Telephone Encounter (Signed)
Followed up with patient regarding abnormal pregnancy (blighted ovum).  Discussed management options in detail with patient, including expectant management, medication management (Cytotec), and surgical management (D&C).  Patient states that she would like to proceed with D&C.  Will schedule for 10/26/2020.  Discussed preop instructions including NPO at midnight prior to procedure.    Patient also requests something stronger for her pain. Notes that she has been having some significant cramping over the past 1-2 days, not relieved by Tylenol or Ibuprofen. Has not had any bleeding. Will send in prescription for Toradol.  Advised that if bleeding occurs and she begins to pass POC's spontaneously prior to Friday, needs to inform MD.     Hildred Laser, MD [facility]

## 2020-10-25 ENCOUNTER — Other Ambulatory Visit: Payer: Self-pay

## 2020-10-25 ENCOUNTER — Other Ambulatory Visit: Payer: Self-pay | Admitting: Obstetrics and Gynecology

## 2020-10-25 ENCOUNTER — Other Ambulatory Visit
Admission: RE | Admit: 2020-10-25 | Discharge: 2020-10-25 | Disposition: A | Discharge status: Home / Self Care | Payer: Medicaid Other | Source: Ambulatory Visit | Attending: Obstetrics and Gynecology | Admitting: Obstetrics and Gynecology

## 2020-10-25 DIAGNOSIS — O021 Missed abortion: Secondary | ICD-10-CM

## 2020-10-25 DIAGNOSIS — Z01818 Encounter for other preprocedural examination: Secondary | ICD-10-CM

## 2020-10-25 NOTE — Patient Instructions (Signed)
Your procedure is scheduled on: 10/26/20  Report to the Registration Desk on the 1st floor of the Medical Mall. To find out your arrival time, please call [phone] between 1PM - 3PM on: 10/25/20  REMEMBER: Instructions that are not followed completely may result in serious medical risk, up to and including death; or upon the discretion of your surgeon and anesthesiologist your surgery may need to be rescheduled.  Do not eat food after midnight the night before surgery.  No gum chewing, lozengers or hard candies.  You may however, drink CLEAR liquids up to 2 hours before you are scheduled to arrive for your surgery. Do not drink anything within 2 hours of your scheduled arrival time.  Clear liquids include: - water  - apple juice without pulp - gatorade (not RED, PURPLE, OR BLUE) - black coffee or tea (Do NOT add milk or creamers to the coffee or tea) Do NOT drink anything that is not on this list.  TAKE THESE MEDICATIONS THE MORNING OF SURGERY WITH A SIP OF WATER: - desvenlafaxine (PRISTIQ)   One week prior to surgery: Stop Anti-inflammatories (NSAIDS) such as Advil, Aleve, Ibuprofen, Motrin, Naproxen, Naprosyn and Aspirin based products such as Excedrin, Goodys Powder, BC Powder.  Stop ANY OVER THE COUNTER supplements until after surgery.  You may however, continue to take Tylenol if needed for pain up until the day of surgery.  No Alcohol for 24 hours before or after surgery.  No Smoking including e-cigarettes for 24 hours prior to surgery.  No chewable tobacco products for at least 6 hours prior to surgery.  No nicotine patches on the day of surgery.  Do not use any "recreational" drugs for at least a week prior to your surgery.  Please be advised that the combination of cocaine and anesthesia may have negative outcomes, up to and including death. If you test positive for cocaine, your surgery will be cancelled.  On the morning of surgery brush your teeth with  toothpaste and water, you may rinse your mouth with mouthwash if you wish. Do not swallow any toothpaste or mouthwash.  Do not wear jewelry, make-up, hairpins, clips or nail polish.  Do not wear lotions, powders, or perfumes.   Do not shave body from the neck down 48 hours prior to surgery just in case you cut yourself which could leave a site for infection.  Also, freshly shaved skin may become irritated if using the CHG soap.  Contact lenses, hearing aids and dentures may not be worn into surgery.  Do not bring valuables to the hospital. [facility] is not responsible for any missing/lost belongings or valuables.   Notify your doctor if there is any change in your medical condition (cold, fever, infection).  Wear comfortable clothing (specific to your surgery type) to the hospital.  After surgery, you can help prevent lung complications by doing breathing exercises.  Take deep breaths and cough every 1-2 hours. Your doctor may order a device called an Incentive Spirometer to help you take deep breaths. When coughing or sneezing, hold a pillow firmly against your incision with both hands. This is called "splinting." Doing this helps protect your incision. It also decreases belly discomfort.  If you are being admitted to the hospital overnight, leave your suitcase in the car. After surgery it may be brought to your room.  If you are being discharged the day of surgery, you will not be allowed to drive home. You will need a responsible adult (18 years  or older) to drive you home and stay with you that night.   If you are taking public transportation, you will need to have a responsible adult (18 years or older) with you. Please confirm with your physician that it is acceptable to use public transportation.   Please call the Pre-admissions Testing Dept. at [phone] if you have any questions about these instructions.  Surgery Visitation Policy:  Patients undergoing a surgery or  procedure may have one family member or support person with them as long as that person is not COVID-19 positive or experiencing its symptoms.  That person may remain in the waiting area during the procedure and may rotate out with other people.  Inpatient Visitation:    Visiting hours are 7 a.m. to 8 p.m. Up to two visitors ages 16+ are allowed at one time in a patient room. The visitors may rotate out with other people during the day. Visitors must check out when they leave, or other visitors will not be allowed. One designated support person may remain overnight. The visitor must pass COVID-19 screenings, use hand sanitizer when entering and exiting the patient's room and wear a mask at all times, including in the patient's room. Patients must also wear a mask when staff or their visitor are in the room. Masking is required regardless of vaccination status.

## 2020-10-26 ENCOUNTER — Ambulatory Visit
Admission: RE | Admit: 2020-10-26 | Discharge: 2020-10-26 | Disposition: A | Discharge status: Home / Self Care | Payer: Medicaid Other | Source: Ambulatory Visit | Attending: Obstetrics and Gynecology | Admitting: Obstetrics and Gynecology

## 2020-10-26 ENCOUNTER — Encounter: Payer: Self-pay | Admitting: Obstetrics and Gynecology

## 2020-10-26 ENCOUNTER — Encounter
Admission: RE | Disposition: A | Discharge status: Home / Self Care | Payer: Self-pay | Source: Ambulatory Visit | Attending: Obstetrics and Gynecology

## 2020-10-26 ENCOUNTER — Ambulatory Visit: Payer: Medicaid Other | Admitting: Anesthesiology

## 2020-10-26 ENCOUNTER — Other Ambulatory Visit: Payer: Self-pay

## 2020-10-26 DIAGNOSIS — Z9889 Other specified postprocedural states: Secondary | ICD-10-CM | POA: Diagnosis not present

## 2020-10-26 DIAGNOSIS — F17290 Nicotine dependence, other tobacco product, uncomplicated: Secondary | ICD-10-CM | POA: Insufficient documentation

## 2020-10-26 DIAGNOSIS — Z880 Allergy status to penicillin: Secondary | ICD-10-CM | POA: Diagnosis not present

## 2020-10-26 DIAGNOSIS — F1190 Opioid use, unspecified, uncomplicated: Secondary | ICD-10-CM | POA: Diagnosis not present

## 2020-10-26 DIAGNOSIS — O288 Other abnormal findings on antenatal screening of mother: Secondary | ICD-10-CM | POA: Insufficient documentation

## 2020-10-26 DIAGNOSIS — O021 Missed abortion: Secondary | ICD-10-CM | POA: Diagnosis not present

## 2020-10-26 DIAGNOSIS — Z3A01 Less than 8 weeks gestation of pregnancy: Secondary | ICD-10-CM | POA: Diagnosis not present

## 2020-10-26 DIAGNOSIS — O99331 Smoking (tobacco) complicating pregnancy, first trimester: Secondary | ICD-10-CM | POA: Diagnosis not present

## 2020-10-26 DIAGNOSIS — Q2739 Arteriovenous malformation, other site: Secondary | ICD-10-CM | POA: Diagnosis not present

## 2020-10-26 DIAGNOSIS — Z79899 Other long term (current) drug therapy: Secondary | ICD-10-CM | POA: Diagnosis not present

## 2020-10-26 DIAGNOSIS — Z885 Allergy status to narcotic agent status: Secondary | ICD-10-CM | POA: Insufficient documentation

## 2020-10-26 HISTORY — PX: DILATION AND CURETTAGE OF UTERUS: SHX78

## 2020-10-26 LAB — URINE DRUG SCREEN, QUALITATIVE (ARMC ONLY)
Amphetamines, Ur Screen: NOT DETECTED
Barbiturates, Ur Screen: NOT DETECTED
Benzodiazepine, Ur Scrn: NOT DETECTED
Cannabinoid 50 Ng, Ur ~~LOC~~: NOT DETECTED
Cocaine Metabolite,Ur ~~LOC~~: POSITIVE — AB
MDMA (Ecstasy)Ur Screen: NOT DETECTED
Methadone Scn, Ur: NOT DETECTED
Opiate, Ur Screen: NOT DETECTED
Phencyclidine (PCP) Ur S: NOT DETECTED
Tricyclic, Ur Screen: NOT DETECTED

## 2020-10-26 LAB — TYPE AND SCREEN
ABO/RH(D): O POS
Antibody Screen: NEGATIVE

## 2020-10-26 SURGERY — DILATION AND CURETTAGE
Anesthesia: General | Site: Uterus

## 2020-10-26 MED ORDER — MIDAZOLAM HCL 2 MG/2ML IJ SOLN
INTRAMUSCULAR | Status: AC
  Filled 2020-10-26: qty 2

## 2020-10-26 MED ORDER — OXYCODONE HCL 5 MG PO TABS
ORAL_TABLET | ORAL | Status: AC
  Filled 2020-10-26: qty 1

## 2020-10-26 MED ORDER — FAMOTIDINE 20 MG PO TABS
ORAL_TABLET | ORAL | Status: AC
  Administered 2020-10-26: 20 mg via ORAL
  Filled 2020-10-26: qty 1

## 2020-10-26 MED ORDER — LIDOCAINE HCL (CARDIAC) PF 100 MG/5ML IV SOSY
PREFILLED_SYRINGE | INTRAVENOUS | Status: DC | PRN
  Administered 2020-10-26: 20 mg via INTRAVENOUS

## 2020-10-26 MED ORDER — LIDOCAINE HCL 1 % IJ SOLN
INTRAMUSCULAR | Status: DC | PRN
  Administered 2020-10-26: 10 mL

## 2020-10-26 MED ORDER — CHLORHEXIDINE GLUCONATE 0.12 % MT SOLN
OROMUCOSAL | Status: AC
  Administered 2020-10-26: 15 mL via OROMUCOSAL
  Filled 2020-10-26: qty 15

## 2020-10-26 MED ORDER — EPHEDRINE SULFATE 50 MG/ML IJ SOLN
INTRAMUSCULAR | Status: DC | PRN
  Administered 2020-10-26: 5 mg via INTRAVENOUS

## 2020-10-26 MED ORDER — IBUPROFEN 600 MG PO TABS: 600.0000 mg | ORAL_TABLET | Freq: Four times a day (QID) | ORAL | 0 refills | Status: AC | PRN

## 2020-10-26 MED ORDER — ONDANSETRON HCL 4 MG/2ML IJ SOLN
INTRAMUSCULAR | Status: DC | PRN
  Administered 2020-10-26: 4 mg via INTRAVENOUS

## 2020-10-26 MED ORDER — FAMOTIDINE 20 MG PO TABS: 20.0000 mg | ORAL_TABLET | Freq: Once | ORAL | Status: AC

## 2020-10-26 MED ORDER — MIDAZOLAM HCL 2 MG/2ML IJ SOLN
INTRAMUSCULAR | Status: DC | PRN
  Administered 2020-10-26: 2 mg via INTRAVENOUS

## 2020-10-26 MED ORDER — ORAL CARE MOUTH RINSE: 15.0000 mL | Freq: Once | OROMUCOSAL | Status: AC

## 2020-10-26 MED ORDER — 0.9 % SODIUM CHLORIDE (POUR BTL) OPTIME
TOPICAL | Status: DC | PRN
  Administered 2020-10-26: 500 mL

## 2020-10-26 MED ORDER — FENTANYL CITRATE (PF) 100 MCG/2ML IJ SOLN
INTRAMUSCULAR | Status: AC
  Filled 2020-10-26: qty 2

## 2020-10-26 MED ORDER — CHLORHEXIDINE GLUCONATE 0.12 % MT SOLN: 15.0000 mL | Freq: Once | OROMUCOSAL | Status: AC

## 2020-10-26 MED ORDER — LIDOCAINE HCL (PF) 1 % IJ SOLN
INTRAMUSCULAR | Status: AC
  Filled 2020-10-26: qty 30

## 2020-10-26 MED ORDER — POVIDONE-IODINE 10 % EX SWAB: 2.0000 "application " | Freq: Once | CUTANEOUS | Status: DC

## 2020-10-26 MED ORDER — FENTANYL CITRATE (PF) 100 MCG/2ML IJ SOLN
INTRAMUSCULAR | Status: DC | PRN
  Administered 2020-10-26 (×2): 50 ug via INTRAVENOUS

## 2020-10-26 MED ORDER — FENTANYL CITRATE (PF) 100 MCG/2ML IJ SOLN
25.0000 ug | INTRAMUSCULAR | Status: DC | PRN
  Administered 2020-10-26 (×3): 25 ug via INTRAVENOUS

## 2020-10-26 MED ORDER — LACTATED RINGERS IV SOLN: INTRAVENOUS | Status: DC

## 2020-10-26 MED ORDER — OXYCODONE HCL 5 MG PO TABS
5.0000 mg | ORAL_TABLET | Freq: Once | ORAL | Status: AC
  Administered 2020-10-26: 5 mg via ORAL

## 2020-10-26 MED ORDER — PROPOFOL 10 MG/ML IV BOLUS
INTRAVENOUS | Status: DC | PRN
  Administered 2020-10-26: 180 mg via INTRAVENOUS

## 2020-10-26 MED ORDER — DOXYCYCLINE HYCLATE 100 MG IV SOLR
200.0000 mg | INTRAVENOUS | Status: AC
  Administered 2020-10-26: 200 mg via INTRAVENOUS
  Filled 2020-10-26: qty 200

## 2020-10-26 MED ORDER — DEXAMETHASONE SODIUM PHOSPHATE 10 MG/ML IJ SOLN
INTRAMUSCULAR | Status: DC | PRN
  Administered 2020-10-26: 8 mg via INTRAVENOUS

## 2020-10-26 MED ORDER — FENTANYL CITRATE (PF) 100 MCG/2ML IJ SOLN
INTRAMUSCULAR | Status: AC
  Administered 2020-10-26: 25 ug via INTRAVENOUS
  Filled 2020-10-26: qty 2

## 2020-10-26 SURGICAL SUPPLY — 20 items
6 disposable flexible curette ×2 IMPLANT
CATH ROBINSON RED A/P 16FR (CATHETERS) ×2 IMPLANT
DRSG TELFA 3X8 NADH (GAUZE/BANDAGES/DRESSINGS) ×2 IMPLANT
GAUZE 4X4 16PLY ~~LOC~~+RFID DBL (SPONGE) ×4 IMPLANT
GLOVE SURG ENC MOIS LTX SZ6.5 (GLOVE) ×2 IMPLANT
GLOVE SURG UNDER LTX SZ7 (GLOVE) ×2 IMPLANT
GOWN STRL REUS W/ TWL LRG LVL3 (GOWN DISPOSABLE) ×2 IMPLANT
GOWN STRL REUS W/TWL LRG LVL3 (GOWN DISPOSABLE) ×4
KIT TURNOVER CYSTO (KITS) ×2 IMPLANT
MANIFOLD NEPTUNE II (INSTRUMENTS) ×2 IMPLANT
NEEDLE HYPO 25X1 1.5 SAFETY (NEEDLE) ×2 IMPLANT
NEEDLE SPNL 25GX3.5 QUINCKE BL (NEEDLE) ×2 IMPLANT
NS IRRIG 500ML POUR BTL (IV SOLUTION) ×2 IMPLANT
PACK DNC HYST (MISCELLANEOUS) ×2 IMPLANT
PAD OB MATERNITY 4.3X12.25 (PERSONAL CARE ITEMS) ×2 IMPLANT
PAD PREP 24X41 OB/GYN DISP (PERSONAL CARE ITEMS) ×2 IMPLANT
SCRUB EXIDINE 4% CHG 4OZ (MISCELLANEOUS) ×2 IMPLANT
SYR 10ML LL (SYRINGE) ×2 IMPLANT
VACURETTE 6 ASPIR F TIP BERK (CANNULA) ×2 IMPLANT
WATER STERILE IRR 500ML POUR (IV SOLUTION) ×2 IMPLANT

## 2020-10-26 NOTE — Discharge Instructions (Signed)
AMBULATORY SURGERY  ?DISCHARGE INSTRUCTIONS ? ? ?The drugs that you were given will stay in your system until tomorrow so for the next 24 hours you should not: ? ?Drive an automobile ?Make any legal decisions ?Drink any alcoholic beverage ? ? ?You may resume regular meals tomorrow.  Today it is better to start with liquids and gradually work up to solid foods. ? ?You may eat anything you prefer, but it is better to start with liquids, then soup and crackers, and gradually work up to solid foods. ? ? ?Please notify your doctor immediately if you have any unusual bleeding, trouble breathing, redness and pain at the surgery site, drainage, fever, or pain not relieved by medication. ? ? ? ?Additional Instructions: ? ? ? ?Please contact your physician with any problems or Same Day Surgery at [phone], Monday through Friday 6 am to 4 pm, or [facility] Main number at [phone].  ?

## 2020-10-26 NOTE — Anesthesia Postprocedure Evaluation (Signed)
Anesthesia Post Note  Patient: Jaclyn Kelly  Procedure(s) Performed: SUCTION DILATATION AND CURETTAGE (Uterus)  Patient location during evaluation: PACU Anesthesia Type: General Level of consciousness: awake and alert Pain management: pain level controlled Vital Signs Assessment: post-procedure vital signs reviewed and stable Respiratory status: spontaneous breathing, nonlabored ventilation, respiratory function stable and patient connected to nasal cannula oxygen Cardiovascular status: blood pressure returned to baseline and stable Postop Assessment: no apparent nausea or vomiting Anesthetic complications: no   No notable events documented.   Last Vitals:  Vitals:   10/26/20 1400 10/26/20 1419  BP: (!) 103/54 102/71  Pulse: 93 94  Resp: 16 16  Temp: 36.8 C (!) 36.1 C  SpO2: 100% 100%    Last Pain:  Vitals:   10/26/20 1419  TempSrc: Temporal  PainSc: 3                  Cleda Mccreedy Nathen Balaban

## 2020-10-26 NOTE — Anesthesia Procedure Notes (Signed)
Procedure Name: LMA Insertion Date/Time: 10/26/2020 12:47 PM Performed by: Jaye Beagle, CRNA Pre-anesthesia Checklist: Patient identified, Emergency Drugs available, Suction available and Patient being monitored Oxygen Delivery Method: Circle system utilized Preoxygenation: Pre-oxygenation with 100% oxygen Induction Type: IV induction Ventilation: Mask ventilation without difficulty LMA: LMA inserted LMA Size: 4.0 Number of attempts: 1 Tube secured with: Tape Dental Injury: Teeth and Oropharynx as per pre-operative assessment

## 2020-10-26 NOTE — H&P (Signed)
GYNECOLOGY PREOPERATIVE HISTORY AND PHYSICAL   Subjective:  Jaclyn Kelly is a 20 y.o. [phone] here for surgical management of missed abortion at [redacted] weeks gestation.   No significant preoperative concerns.  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  Of note, patient has h/o septic AB     Proposed surgery: Suction Dilation and Curettage   Pertinent Gynecological History: Menses: flow is moderatem history of irregular prolonged menses Bleeding: dysfunctional uterine bleeding Contraception: none Last pap: No prior pap history   Past Medical History:  Diagnosis Date   Anxiety    Depression    GERD (gastroesophageal reflux disease)    NO MEDS   Irritable bowel syndrome (IBS) since childhood   Mental disorder    Bipolar/ Manic Depression   Pinched nerve ongoing   in foot, with inflammation   Thoracic outlet syndrome     Past Surgical History:  Procedure Laterality Date   CESAREAN SECTION N/A 02/09/2017   Procedure: CESAREAN SECTION;  Surgeon: Linzie Collin, MD;  Location: [facility] ORS;  Service: Obstetrics;  Laterality: N/A;   CESAREAN SECTION N/A 11/22/2018   Procedure: CESAREAN SECTION REPEAT;  Surgeon: Linzie Collin, MD;  Location: [facility] ORS;  Service: Obstetrics;  Laterality: N/A;   DILATION AND EVACUATION N/A 08/10/2019   Procedure: DILATATION AND EVACUATION;  Surgeon: Linzie Collin, MD;  Location: [facility] ORS;  Service: Gynecology;  Laterality: N/A;   EMBOLIZATION N/A 11/03/2019   Procedure: EMBOLIZATION;  Surgeon: Annice Needy, MD;  Location: [facility] INVASIVE CV LAB;  Service: Cardiovascular;  Laterality: N/A;   FIRST RIB REMOVAL     TONSILLECTOMY      OB History  Gravida Para Term Preterm AB Living  6 2 2  0 3 2  SAB IAB Ectopic Multiple Live Births  1 1 1 1 2     # Outcome Date GA Lbr Len/2nd Weight Sex Delivery Anes PTL Lv  6 Current           5A Ectopic 01/27/18 [redacted]w[redacted]d         5B Term 11/22/18 [redacted]w[redacted]d  4190 g F CS-LTranv Spinal   LIV  4 Term 02/09/17 [redacted]w[redacted]d  4380 g M CS-LTranv EPI  LIV  3 SAB 03/2015 [redacted]w[redacted]d       ND  2 IAB           1 Gravida              Family History  Problem Relation Age of Onset   Hypertension Father    Stroke Father    Heart failure Father    Healthy Mother     Social History   Socioeconomic History   Marital status: Single    Spouse name: Not on file   Number of children: Not on file   Years of education: Not on file   Highest education level: Not on file  Occupational History   Not on file  Tobacco Use   Smoking status: Never   Smokeless tobacco: Never  Vaping Use   Vaping Use: Every day   Substances: Nicotine, Flavoring  Substance and Sexual Activity   Alcohol use: No   Drug use: Not Currently    Types: Cocaine   Sexual activity: Yes    Birth control/protection: None  Other Topics Concern   Not on file  Social History Narrative   Not on file   Social Determinants of Health   Financial Resource Strain: Not on file  Food  Insecurity: Not on file  Transportation Needs: Not on file  Physical Activity: Not on file  Stress: Not on file  Social Connections: Not on file  Intimate Partner Violence: Not on file    No current facility-administered medications on file prior to encounter.   Current Outpatient Medications on File Prior to Encounter  Medication Sig Dispense Refill   acetaminophen (TYLENOL) 500 MG tablet Take 1,500-2,000 mg by mouth every 6 (six) hours as needed (pain.).     Aspirin-Salicylamide-Caffeine (BC FAST PAIN RELIEF) 650-195-33.3 MG PACK Take 1 packet by mouth every 8 (eight) hours as needed (pain.).     desvenlafaxine (PRISTIQ) 100 MG 24 hr tablet Take 2 tablets (200 mg total) by mouth daily. (Patient taking differently: Take 300 mg by mouth in the morning.) 30 tablet 3   ibuprofen (ADVIL) 200 MG tablet Take 400 mg by mouth every 8 (eight) hours as needed (for pain.).     prenatal vitamin w/FE, FA (PRENATAL 1 + 1) 27-1 MG TABS tablet Take 1 tablet  by mouth in the morning.     ketorolac (TORADOL) 10 MG tablet Take 1 tablet (10 mg total) by mouth every 6 (six) hours as needed. (Patient not taking: No sig reported) 20 tablet 0    Allergies  Allergen Reactions   Vicodin [Hydrocodone-Acetaminophen] Itching and Nausea Only   Amoxicillin Hives and Rash    Did it involve swelling of the face/tongue/throat, SOB, or low BP? No Did it involve sudden or severe rash/hives, skin peeling, or any reaction on the inside of your mouth or nose? No Did you need to seek medical attention at a hospital or doctor's office? No When did it last happen? unknown   If all above answers are "NO", may proceed with cephalosporin use. Yeast Infection   Tramadol Nausea And Vomiting, Rash and Swelling    Review of Systems Constitutional: No recent fever/chills/sweats Respiratory: No recent cough/bronchitis Cardiovascular: No chest pain Gastrointestinal: No recent nausea/vomiting/diarrhea Genitourinary: No UTI symptoms Hematologic/lymphatic:No history of coagulopathy or recent blood thinner use    Objective:   Blood pressure 124/80, pulse 89, temperature 98.9 F (37.2 C), temperature source Oral, resp. rate 18, height 5\' 2"  (1.575 m), weight 59 kg, SpO2 97 %, unknown if currently breastfeeding. CONSTITUTIONAL: Well-developed, well-nourished female in no acute distress.  HENT:  Normocephalic, atraumatic, External right and left ear normal. Oropharynx is clear and moist EYES: Conjunctivae and EOM are normal. Pupils are equal, round, and reactive to light. No scleral icterus.  NECK: Normal range of motion, supple, no masses SKIN: Skin is warm and dry. No rash noted. Not diaphoretic. No erythema. No pallor. NEUROLOGIC: Alert and oriented to person, place, and time. Normal reflexes, muscle tone coordination. No cranial nerve deficit noted. PSYCHIATRIC: Normal mood and affect. Normal behavior. Normal judgment and thought content. CARDIOVASCULAR: Normal heart rate  noted, regular rhythm RESPIRATORY: Effort and breath sounds normal, no problems with respiration noted ABDOMEN: Soft, nontender, nondistended. PELVIC: Deferred MUSCULOSKELETAL: Normal range of motion. No edema and no tenderness. 2+ distal pulses.    Labs: Results for orders placed or performed during the hospital encounter of 10/26/20 (from the past 336 hour(s))  Type and screen [facility]   Collection Time: 10/26/20 11:17 AM  Result Value Ref Range   ABO/RH(D) PENDING    Antibody Screen PENDING    Sample Expiration      10/29/2020,2359 Performed at [facility], [address], [place], [place]   Results for  orders placed or performed in visit on 10/17/20 (from the past 336 hour(s))  Beta hCG quant (ref lab)   Collection Time: 10/17/20  2:32 PM  Result Value Ref Range   hCG Quant 12,064 mIU/mL     Imaging Studies: US OB Comp Less 14 Wks  Result Date: 10/19/2020 Patient Name: Jaclyn Kelly DOB: [birth year] MRN: [id number] ULTRASOUND REPORT Location: [facility] Date of Service: 10/17/2020 Indications:Unsure LMP Findings: The uterus is retroflexed on the TV scan. In the fundal region of the endometrium, there is a presumed Gestational Sac consistent with [redacted]w[redacted]d gestation, however, there is: No Fetal Pole seen. No Fetal Cardiac Activity seen. No Yolk Sac seen. No Amnion seen. Adjacent to the GS, there is a 2.5 cm, crescent shaped fluid collection, likely subchorionic hemorrhage. In the Right Cornual region, there is a complex cystic structure measuring 2.1 cm....possibly related to previous AV malformation and embolization. Right Ovary is normal in appearance. Left Ovary is normal appearance. Corpus luteal cyst:  is not visualized Survey of the adnexa demonstrates no adnexal masses. There is a trace of free peritoneal fluid in the cul de sac. Impression: 1. 5wk4d Gestational Sac with no Fetal Pole or cardiac activity, suspicious for MAB.  Recommendations: 1.Clinical correlation with the patient's History and Physical Exam. 2. Patient reports prior US scan done recently in the ER...correlate those results if obtainable. Sheralyn Boatman  Henderson-Gainey I have reviewed this study and agree with documented findings. Hildred Laser, MD [facility]    Ultrasound Result Date 09/21/2020: CLINICAL DATA:  Lower abdominal pain.  First trimester of pregnancy.   EXAM: OBSTETRIC <14 WK [place] AND TRANSVAGINAL OB US   TECHNIQUE: Both transabdominal and transvaginal ultrasound examinations were performed for complete evaluation of the gestation as well as the maternal uterus, adnexal regions, and pelvic cul-de-sac. Transvaginal technique was performed to assess early pregnancy.   COMPARISON:  February 08, 2020.   FINDINGS: Intrauterine gestational sac: Single   Yolk sac:  Not Visualized.   Embryo:  Visualized.   Cardiac Activity: Not Visualized.   CRL:  4.7 mm   6 w   1 d                  [place] EDC: May 16, 2021.   Subchorionic hemorrhage:  None visualized.   Maternal uterus/adnexae: Ovaries are unremarkable. No free fluid is noted.   IMPRESSION: Findings are suspicious but not yet definitive for failed pregnancy. Recommend follow-up US in 10-14 days for definitive diagnosis. This recommendation follows SRU consensus guidelines: Diagnostic Criteria for Nonviable Pregnancy Early in the First Trimester. Malva Limes Med 2013; 621:3086-57.     Electronically Signed   By: Lupita Raider M.D.   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 Likelihood of success in alleviating the patient's condition was discussed. Routine postoperative instructions  will be reviewed with the patient and her family in detail after surgery.  The patient concurred with the proposed plan, giving informed written consent for the surgery.   Preop testing reviewed. Patient has been NPO since midnight.

## 2020-10-26 NOTE — Anesthesia Preprocedure Evaluation (Addendum)
Anesthesia Evaluation  Patient identified by MRN, date of birth, ID band Patient awake    Reviewed: Allergy & Precautions, H&P , NPO status , Patient's Chart, lab work & pertinent test results  History of Anesthesia Complications Negative for: history of anesthetic complications  Airway Mallampati: III  TM Distance: >3 FB Neck ROM: full    Dental  (+) Chipped, Poor Dentition, Missing   Pulmonary neg shortness of breath, sleep apnea , Patient abstained from smoking.,    Pulmonary exam normal        Cardiovascular Exercise Tolerance: Good (-) angina(-) Past MI and (-) DOE negative cardio ROS Normal cardiovascular exam     Neuro/Psych PSYCHIATRIC DISORDERS Anxiety Depression Bipolar Disorder  Neuromuscular disease    GI/Hepatic GERD  Medicated and Controlled,(+)     substance abuse  cocaine use,   Endo/Other  negative endocrine ROS  Renal/GU negative Renal ROS  negative genitourinary   Musculoskeletal negative musculoskeletal ROS (+)   Abdominal   Peds negative pediatric ROS (+)  Hematology negative hematology ROS (+)   Anesthesia Other Findings Patient reports no problems with oxycodone   Past Medical History: No date: Anxiety No date: Depression No date: GERD (gastroesophageal reflux disease)     Comment:  NO MEDS since childhood: Irritable bowel syndrome (IBS) No date: Mental disorder     Comment:  Bipolar/ Manic Depression ongoing: Pinched nerve     Comment:  in foot, with inflammation No date: Thoracic outlet syndrome  Past Surgical History: 02/09/2017: CESAREAN SECTION; N/A     Comment:  Procedure: CESAREAN SECTION;  Surgeon: Linzie Collin, MD;  Location: [facility] ORS;  Service: Obstetrics;                Laterality: N/A; No date: FIRST RIB REMOVAL No date: TONSILLECTOMY  BMI    Body Mass Index: 31.83 kg/m      Reproductive/Obstetrics negative OB ROS                            Anesthesia Physical  Anesthesia Plan  ASA: 3  Anesthesia Plan: General LMA   Post-op Pain Management:    Induction: Intravenous  PONV Risk Score and Plan: Dexamethasone, Ondansetron, Midazolam and Treatment may vary due to age or medical condition  Airway Management Planned: LMA  Additional Equipment:   Intra-op Plan:   Post-operative Plan: Extubation in OR  Informed Consent: I have reviewed the patients History and Physical, chart, labs and discussed the procedure including the risks, benefits and alternatives for the proposed anesthesia with the patient or authorized representative who has indicated his/her understanding and acceptance.     Dental Advisory Given  Plan Discussed with: Anesthesiologist, CRNA and Surgeon  Anesthesia Plan Comments: (Cocaine + UDS.  Patient appears non toxic at this time.  Dr. Valentino Saxon would like to proceed.  Patient consented for risks of anesthesia including but not limited to:  - adverse reactions to medications - damage to eyes, teeth, lips or other oral mucosa - nerve damage due to positioning  - sore throat or hoarseness - Damage to heart, brain, nerves, lungs, other parts of body or loss of life  Patient voiced understanding.)     Anesthesia Quick Evaluation

## 2020-10-26 NOTE — Op Note (Signed)
Procedure(s): SUCTION DILATATION AND CURETTAGE Procedure Note  Jaclyn Kelly female 20 y.o. 10/26/2020  Indications: The patient is a 20 y.o. [id number] female with missed abortion at ~ [redacted] weeks gestation.  Also with history of AV malformation of the uterus, s/p uterine artery embolization.  She, of note tested positive today for opiate use with pre-op testing.   Pre-operative Diagnosis: Missed abortion at 6 weeks, h/o AV malormation of the uterus, s/p uterine artery embolization, and  opiate positive UDS  Post-operative Diagnosis: Same  Surgeon: Hildred Laser, MD  Assistants:  None  Anesthesia: General endotracheal anesthesia   Findings: The uterus was sounded to 11 cm.   Uterus was posterior and near my mouth week size. Tissue consistent with products of conception was removed and sent to Pathology.  Procedure Details: The patient was seen in the Holding Room. The risks, benefits, complications, treatment options, and expected outcomes were discussed with the patient.  The patient concurred with the proposed plan, giving informed consent.  The site of surgery properly noted/marked. The patient was taken to the Operating Room, identified as Jaclyn Kelly and the procedure verified as Procedure(s) (LRB):  SUCTION DILATATION AND CURETTAGE (N/A). A Time Out was held and the above information confirmed.  She was then placed under general anesthesia without difficulty. She was placed in the dorsal lithotomy position, and was prepped and draped in a sterile manner.  A straight catheterization was performed. A sterile speculum was inserted into the vagina and the cervix was grasped at the anterior lip using a single-toothed tenaculum.  The uterus was sounded to 11 cm, The cervix was gently dilated using Hank's Dilators to accommodate a 6 mm suction curette, that was gently advanced to the uterine fundus.  The suction device was then activated and the curette was slowly rotated to clear the  uterine cavity of products of conception.  A sharp curettage with a serrated curette  was then performed to confirm complete emptying of the uterus. Minimal bleeding was encountered. The tenaculum was removed along with all instruments  from the  vagina.   The patient was awakened, mobilized and taken to the recovery room in satisfactory condition.  The procedure was well-tolerated.  The patient will be discharged to home as per PACU criteria.  Routine postoperative instructions were given along with a prescription for analgesics.  She will follow up in the clinic in 1-2 weeks for postoperative evaluation.   Estimated Blood Loss:  200      Drains: None         Total IV Fluids:  500 ml  Specimens: Products of conception         Implants: None         Complications:  None; patient tolerated the procedure well.         Disposition: PACU - hemodynamically stable.         Condition: stable   Hildred Laser, MD [facility]

## 2020-10-26 NOTE — Transfer of Care (Signed)
Immediate Anesthesia Transfer of Care Note  Patient: Jaclyn Kelly  Procedure(s) Performed: SUCTION DILATATION AND CURETTAGE (Uterus)  Patient Location: PACU  Anesthesia Type:General  Level of Consciousness: drowsy  Airway & Oxygen Therapy: Patient Spontanous Breathing and Patient connected to face mask oxygen  Post-op Assessment: Report given to RN  Post vital signs: stable  Last Vitals:  Vitals Value Taken Time  BP 102/72 10/26/20 1308  Temp 36.2 C 10/26/20 1308  Pulse 80 10/26/20 1310  Resp 20 10/26/20 1310  SpO2 100 % 10/26/20 1310  Vitals shown include unvalidated device data.  Last Pain:  Vitals:   10/26/20 1116  TempSrc: Oral  PainSc: 0-No pain         Complications: No notable events documented.

## 2020-10-29 ENCOUNTER — Encounter: Payer: Self-pay | Admitting: Obstetrics and Gynecology

## 2020-10-29 LAB — SURGICAL PATHOLOGY

## 2020-11-13 ENCOUNTER — Encounter: Payer: Medicaid Other | Admitting: Obstetrics and Gynecology

## 2021-04-02 ENCOUNTER — Encounter: Payer: Self-pay | Admitting: Obstetrics and Gynecology

## 2021-04-09 NOTE — Telephone Encounter (Signed)
Medical Records Release form sent to patient via scanned to email. I am waiting on her to send forms back to me so I can fax off referral. ?

## 2021-04-10 ENCOUNTER — Encounter: Payer: Self-pay | Admitting: Obstetrics and Gynecology

## 2021-04-16 ENCOUNTER — Encounter: Payer: Self-pay | Admitting: Obstetrics and Gynecology

## 2021-04-18 ENCOUNTER — Encounter: Payer: Medicaid Other | Admitting: Obstetrics and Gynecology

## 2021-07-23 ENCOUNTER — Ambulatory Visit (LOCAL_COMMUNITY_HEALTH_CENTER): Payer: Self-pay

## 2021-07-23 DIAGNOSIS — Z111 Encounter for screening for respiratory tuberculosis: Secondary | ICD-10-CM

## 2021-07-23 NOTE — Progress Notes (Signed)
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 PPD placed today. Pt explains that RN at her agency plans to do the PPDR. It was explained to pt that if [facility] does not do PPDR then no copy of results will be given. Pt verbalizes understanding. RN provided pt with copy of PPD placement details.  Jerel Shepherd, RN

## 2021-10-02 ENCOUNTER — Emergency Department
Admission: EM | Admit: 2021-10-02 | Discharge: 2021-10-02 | Disposition: A | Discharge status: Home / Self Care | Payer: Medicaid Other | Attending: Emergency Medicine | Admitting: Emergency Medicine

## 2021-10-02 ENCOUNTER — Other Ambulatory Visit: Payer: Self-pay

## 2021-10-02 DIAGNOSIS — T402X1A Poisoning by other opioids, accidental (unintentional), initial encounter: Secondary | ICD-10-CM | POA: Diagnosis present

## 2021-10-02 DIAGNOSIS — T40601A Poisoning by unspecified narcotics, accidental (unintentional), initial encounter: Secondary | ICD-10-CM

## 2021-10-02 MED ORDER — ACETAMINOPHEN 500 MG PO TABS
1000.0000 mg | ORAL_TABLET | Freq: Once | ORAL | Status: AC
  Administered 2021-10-02: 1000 mg via ORAL
  Filled 2021-10-02: qty 2

## 2021-10-02 MED ORDER — NALOXONE HCL 4 MG/0.1ML NA LIQD: NASAL | 1 refills | Status: AC

## 2021-10-02 NOTE — Discharge Instructions (Signed)
Please seek medical attention for any high fevers, chest pain, shortness of breath, change in behavior, persistent vomiting, bloody stool or any other new or concerning symptoms.  

## 2021-10-02 NOTE — ED Triage Notes (Addendum)
Pt comes via EMS from nail salon with c/o overdose. Pt states she uses perocets as many as possible daily. Pt states today she used cocaine and fent and snorted it. Pt states she then went into salon and felt like she was overdosing so she called 911.  EMS reports pt was 85% RA and they gave 1 of narcan. Pt came back around.   Pt arrives alert&O X4. VSS per ems now.

## 2021-10-02 NOTE — ED Provider Notes (Signed)
   [facility] Provider Note    Event Date/Time   First MD Initiated Contact with Patient 10/02/21 1741     (approximate)   History   Drug Overdose   HPI  Jaclyn Kelly is a 21 y.o. female  who presents to the emergency department today because of concern for fentanyl overdose. The patient does admit to snorting fentanyl and cocaine. Did require narcan in the field. At the time of my exam the patient denies any shortness of breath.     Physical Exam   Triage Vital Signs: ED Triage Vitals  Enc Vitals Group     BP 10/02/21 1739 97/78     Pulse Rate 10/02/21 1739 98     Resp 10/02/21 1739 18     Temp 10/02/21 1739 97.6 F (36.4 C)     Temp Source 10/02/21 1739 Oral     SpO2 10/02/21 1739 98 %     Weight --      Height --      Head Circumference --      Peak Flow --      Pain Score 10/02/21 1735 0     Pain Loc --      Pain Edu? --      Excl. in [place]? --     Most recent vital signs: Vitals:   10/02/21 1739  BP: 97/78  Pulse: 98  Resp: 18  Temp: 97.6 F (36.4 C)  SpO2: 98%    General: Awake, alert, oriented. CV:  Good peripheral perfusion. Regular rate and rhythm. Resp:  Normal effort. Lungs clear. Abd:  No distention.     ED Results / Procedures / Treatments   Labs (all labs ordered are listed, but only abnormal results are displayed) Labs Reviewed  POC URINE PREG, ED     EKG  I, Nance Pear, attending physician, personally viewed and interpreted this EKG  EKG Time: 1748 Rate: 90 Rhythm: normal sinus rhythm Axis: normal Intervals: qtc normal QRS: narrow ST changes: no st elevation Impression: normal ekg   RADIOLOGY None  PROCEDURES:  Critical Care performed: No  Procedures   MEDICATIONS ORDERED IN ED: Medications - No data to display   IMPRESSION / MDM / [place] / ED COURSE  I reviewed the triage vital signs and the nursing notes.                              Differential diagnosis  includes, but is not limited to, opioid overdose, other medication/drug overdose.  Patient's presentation is most consistent with acute presentation with potential threat to life or bodily function.  Patient presented to the emergency department today after apparent opioid overdose.  Patient did require Narcan in the field.  At the time my exam patient was awake and alert.  Patient was observed in the emergency department without any concerning respiratory distress.  We will plan on discharging.   FINAL CLINICAL IMPRESSION(S) / ED DIAGNOSES   Final diagnoses:  Opiate overdose, accidental or unintentional, initial encounter [facility])    Note:  This document was prepared using Dragon voice recognition software and may include unintentional dictation errors.    Nance Pear, MD 10/02/21 [phone]

## 2021-11-11 ENCOUNTER — Encounter (INDEPENDENT_AMBULATORY_CARE_PROVIDER_SITE_OTHER): Payer: Self-pay

## 2021-12-10 ENCOUNTER — Other Ambulatory Visit: Payer: Self-pay

## 2021-12-10 ENCOUNTER — Emergency Department
Admission: EM | Admit: 2021-12-10 | Discharge: 2021-12-10 | Disposition: A | Discharge status: Home / Self Care | Payer: Medicaid Other | Attending: Emergency Medicine | Admitting: Emergency Medicine

## 2021-12-10 ENCOUNTER — Encounter: Payer: Self-pay | Admitting: Emergency Medicine

## 2021-12-10 DIAGNOSIS — M549 Dorsalgia, unspecified: Secondary | ICD-10-CM | POA: Diagnosis present

## 2021-12-10 DIAGNOSIS — M542 Cervicalgia: Secondary | ICD-10-CM | POA: Insufficient documentation

## 2021-12-10 DIAGNOSIS — S39012A Strain of muscle, fascia and tendon of lower back, initial encounter: Secondary | ICD-10-CM | POA: Diagnosis not present

## 2021-12-10 DIAGNOSIS — Y92524 Gas station as the place of occurrence of the external cause: Secondary | ICD-10-CM | POA: Insufficient documentation
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
## 2021-12-10 NOTE — Discharge Instructions (Signed)
Take acetaminophen 650 mg and ibuprofen 400 mg every 6 hours for pain.  Take with food. Thank you for choosing us for your health care today!  Please see your primary doctor this week for a follow up appointment.   If you do not have a primary doctor call the following clinics to establish care:  If you have insurance:  [facility] [phone] [address] [place] [address]   [facility]  [phone] [address] [place] [address]   If you do not have insurance:  [facility]  [phone] [address] [place] [address]  Sometimes, in the early stages of certain disease courses it is difficult to detect in the emergency department evaluation -- so, it is important that you continue to monitor your symptoms and call your doctor right away or return to the emergency department if you develop any new or worsening symptoms.  It was my pleasure to care for you today.   Halo Shevlin S. Ziana Heyliger, MD  

## 2022-01-14 ENCOUNTER — Ambulatory Visit: Payer: Medicaid Other | Admitting: Obstetrics and Gynecology

## 2022-02-03 ENCOUNTER — Ambulatory Visit: Payer: Medicaid Other

## 2022-02-03 NOTE — Patient Instructions (Incomplete)
Commonly Asked Questions During Pregnancy  Cats: A parasite can be excreted in cat feces.  To avoid exposure you need to have another person empty the little box.  If you must empty the litter box you will need to wear gloves.  Wash your hands after handling your cat.  This parasite can also be found in raw or undercooked meat so this should also be avoided.  Colds, Sore Throats, Flu: Please check your medication sheet to see what you can take for symptoms.  If your symptoms are unrelieved by these medications please call the office.  Dental Work: Most any dental work your dentist recommends is permitted.  X-rays should only be taken during the first trimester if absolutely necessary.  Your abdomen should be shielded with a lead apron during all x-rays.  Please notify your provider prior to receiving any x-rays.  Novocaine is fine; gas is not recommended.  If your dentist requires a note from us prior to dental work please call the office and we will provide one for you.  Exercise: Exercise is an important part of staying healthy during your pregnancy.  You may continue most exercises you were accustomed to prior to pregnancy.  Later in your pregnancy you will most likely notice you have difficulty with activities requiring balance like riding a bicycle.  It is important that you listen to your body and avoid activities that put you at a higher risk of falling.  Adequate rest and staying well hydrated are a must!  If you have questions about the safety of specific activities ask your provider.    Exposure to Children with illness: Try to avoid obvious exposure; report any symptoms to us when noted,  If you have chicken pos, red measles or mumps, you should be immune to these diseases.   Please do not take any vaccines while pregnant unless you have checked with your OB provider.  Fetal Movement: After 28 weeks we recommend you do "kick counts" twice daily.  Lie or sit down in a calm quiet environment and  count your baby movements "kicks".  You should feel your baby at least 10 times per hour.  If you have not felt 10 kicks within the first hour get up, walk around and have something sweet to eat or drink then repeat for an additional hour.  If count remains less than 10 per hour notify your provider.  Fumigating: Follow your pest control agent's advice as to how long to stay out of your home.  Ventilate the area well before re-entering.  Hemorrhoids:   Most over-the-counter preparations can be used during pregnancy.  Check your medication to see what is safe to use.  It is important to use a stool softener or fiber in your diet and to drink lots of liquids.  If hemorrhoids seem to be getting worse please call the office.   Hot Tubs:  Hot tubs Jacuzzis and saunas are not recommended while pregnant.  These increase your internal body temperature and should be avoided.  Intercourse:  Sexual intercourse is safe during pregnancy as long as you are comfortable, unless otherwise advised by your provider.  Spotting may occur after intercourse; report any bright red bleeding that is heavier than spotting.  Labor:  If you know that you are in labor, please go to the hospital.  If you are unsure, please call the office and let us help you decide what to do.  Lifting, straining, etc:  If your job requires heavy   lifting or straining please check with your provider for any limitations.  Generally, you should not lift items heavier than that you can lift simply with your hands and arms (no back muscles)  Painting:  Paint fumes do not harm your pregnancy, but may make you ill and should be avoided if possible.  Latex or water based paints have less odor than oils.  Use adequate ventilation while painting.  Permanents & Hair Color:  Chemicals in hair dyes are not recommended as they cause increase hair dryness which can increase hair loss during pregnancy.  " Highlighting" and permanents are allowed.  Dye may be  absorbed differently and permanents may not hold as well during pregnancy.  Sunbathing:  Use a sunscreen, as skin burns easily during pregnancy.  Drink plenty of fluids; avoid over heating.  Tanning Beds:  Because their possible side effects are still unknown, tanning beds are not recommended.  Ultrasound Scans:  Routine ultrasounds are performed at approximately 20 weeks.  You will be able to see your baby's general anatomy an if you would like to know the gender this can usually be determined as well.  If it is questionable when you conceived you may also receive an ultrasound early in your pregnancy for dating purposes.  Otherwise ultrasound exams are not routinely performed unless there is a medical necessity.  Although you can request a scan we ask that you pay for it when conducted because insurance does not cover " patient request" scans.  Work: If your pregnancy proceeds without complications you may work until your due date, unless your physician or employer advises otherwise.  Round Ligament Pain/Pelvic Discomfort:  Sharp, shooting pains not associated with bleeding are fairly common, usually occurring in the second trimester of pregnancy.  They tend to be worse when standing up or when you remain standing for long periods of time.  These are the result of pressure of certain pelvic ligaments called "round ligaments".  Rest, Tylenol and heat seem to be the most effective relief.  As the womb and fetus grow, they rise out of the pelvis and the discomfort improves.  Please notify the office if your pain seems different than that described.  It may represent a more serious condition.  First Trimester of Pregnancy  The first trimester of pregnancy starts on the first day of your last menstrual period until the end of week 12. This is months 1 through 3 of pregnancy. A week after a sperm fertilizes an egg, the egg will implant into the wall of the uterus and begin to develop into a baby. By the  end of 12 weeks, all the baby's organs will be formed and the baby will be 2-3 inches in size. Body changes during your first trimester Your body goes through many changes during pregnancy. The changes vary and generally return to normal after your baby is born. Physical changes You may gain or lose weight. Your breasts may begin to grow larger and become tender. The tissue that surrounds your nipples (areola) may become darker. Dark spots or blotches (chloasma or mask of pregnancy) may develop on your face. You may have changes in your hair. These can include thickening or thinning of your hair or changes in texture. Health changes You may feel nauseous, and you may vomit. You may have heartburn. You may develop headaches. You may develop constipation. Your gums may bleed and may be sensitive to brushing and flossing. Other changes You may tire easily. You may urinate  more often. Your menstrual periods will stop. You may have a loss of appetite. You may develop cravings for certain kinds of food. You may have changes in your emotions from day to day. You may have more vivid and strange dreams. Follow these instructions at home: Medicines Follow your health care provider's instructions regarding medicine use. Specific medicines may be either safe or unsafe to take during pregnancy. Do not take any medicines unless told to by your health care provider. Take a prenatal vitamin that contains at least 600 micrograms (mcg) of folic acid. Eating and drinking Eat a healthy diet that includes fresh fruits and vegetables, whole grains, good sources of protein such as meat, eggs, or tofu, and low-fat dairy products. Avoid raw meat and unpasteurized juice, milk, and cheese. These carry germs that can harm you and your baby. If you feel nauseous or you vomit: Eat 4 or 5 small meals a day instead of 3 large meals. Try eating a few soda crackers. Drink liquids between meals instead of during  meals. You may need to take these actions to prevent or treat constipation: Drink enough fluid to keep your urine pale yellow. Eat foods that are high in fiber, such as beans, whole grains, and fresh fruits and vegetables. Limit foods that are high in fat and processed sugars, such as fried or sweet foods. Activity Exercise only as directed by your health care provider. Most people can continue their usual exercise routine during pregnancy. Try to exercise for 30 minutes at least 5 days a week. Stop exercising if you develop pain or cramping in the lower abdomen or lower back. Avoid exercising if it is very hot or humid or if you are at high altitude. Avoid heavy lifting. If you choose to, you may have sex unless your health care provider tells you not to. Relieving pain and discomfort Wear a good support bra to relieve breast tenderness. Rest with your legs elevated if you have leg cramps or low back pain. If you develop bulging veins (varicose veins) in your legs: Wear support hose as told by your health care provider. Elevate your feet for 15 minutes, 3-4 times a day. Limit salt in your diet. Safety Wear your seat belt at all times when driving or riding in a car. Talk with your health care provider if someone is verbally or physically abusive to you. Talk with your health care provider if you are feeling sad or have thoughts of hurting yourself. Lifestyle Do not use hot tubs, steam rooms, or saunas. Do not douche. Do not use tampons or scented sanitary pads. Do not use herbal remedies, alcohol, illegal drugs, or medicines that are not approved by your health care provider. Chemicals in these products can harm your baby. Do not use any products that contain nicotine or tobacco, such as cigarettes, e-cigarettes, and chewing tobacco. If you need help quitting, ask your health care provider. Avoid cat litter boxes and soil used by cats. These carry germs that can cause birth defects in the  baby and possibly loss of the unborn baby (fetus) by miscarriage or stillbirth. General instructions During routine prenatal visits in the first trimester, your health care provider will do a physical exam, perform necessary tests, and ask you how things are going. Keep all follow-up visits. This is important. Ask for help if you have counseling or nutritional needs during pregnancy. Your health care provider can offer advice or refer you to specialists for help with various needs. Schedule a   dentist appointment. At home, brush your teeth with a soft toothbrush. Floss gently. Write down your questions. Take them to your prenatal visits. Where to find more information American Pregnancy Association: americanpregnancy.org American College of Obstetricians and Gynecologists: acog.org/en/Womens%20Health/Pregnancy Office on Women's Health: womenshealth.gov/pregnancy Contact a health care provider if you have: Dizziness. A fever. Mild pelvic cramps, pelvic pressure, or nagging pain in the abdominal area. Nausea, vomiting, or diarrhea that lasts for 24 hours or longer. A bad-smelling vaginal discharge. Pain when you urinate. Known exposure to a contagious illness, such as chickenpox, measles, Zika virus, HIV, or hepatitis. Get help right away if you have: Spotting or bleeding from your vagina. Severe abdominal cramping or pain. Shortness of breath or chest pain. Any kind of trauma, such as from a fall or a car crash. New or increased pain, swelling, or redness in an arm or leg. Summary The first trimester of pregnancy starts on the first day of your last menstrual period until the end of week 12 (months 1 through 3). Eating 4 or 5 small meals a day rather than 3 large meals may help to relieve nausea and vomiting. Do not use any products that contain nicotine or tobacco, such as cigarettes, e-cigarettes, and chewing tobacco. If you need help quitting, ask your health care provider. Keep all  follow-up visits. This is important. This information is not intended to replace advice given to you by your health care provider. Make sure you discuss any questions you have with your health care provider. Document Revised: 06/08/2019 Document Reviewed: 04/14/2019 Elsevier Patient Education  2023 Elsevier Inc.  Common Medications Safe in Pregnancy  Acne:      Constipation:  Benzoyl Peroxide     Colace  Clindamycin      Dulcolax Suppository  Topica Erythromycin     Fibercon  Salicylic Acid      Metamucil         Miralax AVOID:        Senakot   Accutane    Cough:  Retin-A       Cough Drops  Tetracycline      Phenergan w/ Codeine if Rx  Minocycline      Robitussin (Plain & DM)  Antibiotics:     Crabs/Lice:  Ceclor       RID  Cephalosporins    AVOID:  E-Mycins      Kwell  Keflex  Macrobid/Macrodantin   Diarrhea:  Penicillin      Kao-Pectate  Zithromax      Imodium AD         PUSH FLUIDS AVOID:       Cipro     Fever:  Tetracycline      Tylenol (Regular or Extra  Minocycline       Strength)  Levaquin      Extra Strength-Do not          Exceed 8 tabs/24 hrs Caffeine:        <200mg/day (equiv. To 1 cup of coffee or  approx. 3 12 oz sodas)         Gas: Cold/Hayfever:       Gas-X  Benadryl      Mylicon  Claritin       Phazyme  **Claritin-D        Chlor-Trimeton    Headaches:  Dimetapp      ASA-Free Excedrin  Drixoral-Non-Drowsy     Cold Compress  Mucinex (Guaifenasin)     Tylenol (Regular or Extra  Sudafed/Sudafed-12 Hour       Strength)  **Sudafed PE Pseudoephedrine   Tylenol Cold & Sinus     Vicks Vapor Rub  Zyrtec  **AVOID if Problems With Blood Pressure         Heartburn: Avoid lying down for at least 1 hour after meals  Aciphex      Maalox     Rash:  Milk of Magnesia     Benadryl    Mylanta       1% Hydrocortisone Cream  Pepcid  Pepcid Complete   Sleep Aids:  Prevacid      Ambien   Prilosec       Benadryl  Rolaids       Chamomile Tea  Tums (Limit  4/day)     Unisom         Tylenol PM         Warm milk-add vanilla or  Hemorrhoids:       Sugar for taste  Anusol/Anusol H.C.  (RX: Analapram 2.5%)  Sugar Substitutes:  Hydrocortisone OTC     Ok in moderation  Preparation H      Tucks        Vaseline lotion applied to tissue with wiping    Herpes:     Throat:  Acyclovir      Oragel  Famvir  Valtrex     Vaccines:         Flu Shot Leg Cramps:       *Gardasil  Benadryl      Hepatitis A         Hepatitis B Nasal Spray:       Pneumovax  Saline Nasal Spray     Polio Booster         Tetanus Nausea:       Tuberculosis test or PPD  Vitamin B6 25 mg TID   AVOID:    Dramamine      *Gardasil  Emetrol       Live Poliovirus  Ginger Root 250 mg QID    MMR (measles, mumps &  High Complex Carbs @ Bedtime    rebella)  Sea Bands-Accupressure    Varicella (Chickenpox)  Unisom 1/2 tab TID     *No known complications           If received before Pain:         Known pregnancy;   Darvocet       Resume series after  Lortab        Delivery  Percocet    Yeast:   Tramadol      Femstat  Tylenol 3      Gyne-lotrimin  Ultram       Monistat  Vicodin           MISC:         All Sunscreens           Hair Coloring/highlights          Insect Repellant's          (Including DEET)         Mystic Tans  

## 2022-02-03 NOTE — Progress Notes (Deleted)
    NURSE VISIT NOTE  Subjective:    Patient ID: Jaclyn Kelly, female    DOB: 06-06-2000, 22 y.o.   MRN: [id number]  HPI  Patient is a 22 y.o. [id number] female who presents for evaluation of amenorrhea. She believes she could be pregnant. {pregnancy desire:14500::"Pregnancy is desired."} Sexual Activity: {sexual partners:705}. Current symptoms also include: {preg sx:18128}. Last period was {norm/abn:16337}.    Objective:    There were no vitals taken for this visit.  Lab Review  No results found for any visits on 02/03/22.  Assessment:   1. Amenorrhea     Plan:   {AOB + PREGNANCY PLAN:28596:s}  {AOB - PREGNANCY KPTW:65681:E}   Otila Kluver, LPN

## 2022-02-07 NOTE — Progress Notes (Deleted)
    NURSE VISIT NOTE  Subjective:    Patient ID: Jaclyn Kelly, female    DOB: 01-04-2001, 22 y.o.   MRN: [id number]  HPI  Patient is a 22 y.o. [id number] female who presents for confirmation of pregnancy.  UPT resulted as ***. LMP ***. (*** if positive) She has been advised to schedule a NOB Nurse Interview at checkout.   Patient states no other questions or concerns.   The following portions of the patient's history were reviewed and updated as appropriate: allergies, current medications, past family history, past medical history, past social history, past surgical history, and problem list.  Review of Systems Pertinent items are noted in HPI.   Objective:   There were no vitals taken for this visit. There is no height or weight on file to calculate BMI.  General appearance: alert, cooperative, and no distress  Assessment:   No diagnosis found.   Plan:       Cristy Folks, [place]

## 2022-02-10 NOTE — Progress Notes (Signed)
This encounter was created in error - please disregard.

## 2022-08-28 ENCOUNTER — Other Ambulatory Visit: Payer: Self-pay

## 2022-08-28 ENCOUNTER — Emergency Department: Payer: MEDICAID

## 2022-08-28 ENCOUNTER — Emergency Department
Admission: EM | Admit: 2022-08-28 | Discharge: 2022-08-28 | Disposition: A | Discharge status: Home / Self Care | Payer: MEDICAID | Attending: Emergency Medicine | Admitting: Emergency Medicine

## 2022-08-28 DIAGNOSIS — R109 Unspecified abdominal pain: Secondary | ICD-10-CM | POA: Diagnosis not present

## 2022-08-28 DIAGNOSIS — M545 Low back pain, unspecified: Secondary | ICD-10-CM | POA: Diagnosis present

## 2022-08-28 MED ORDER — MELOXICAM 15 MG PO TABS: 15.0000 mg | ORAL_TABLET | Freq: Every day | ORAL | 1 refills | Status: AC

## 2022-08-28 MED ORDER — METHOCARBAMOL 500 MG PO TABS: 500.0000 mg | ORAL_TABLET | Freq: Three times a day (TID) | ORAL | 0 refills | Status: AC | PRN
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
## 2022-08-28 NOTE — Discharge Instructions (Signed)
Take Meloxicam and Robaxin as directed.  
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
## 2023-08-30 NOTE — Progress Notes (Deleted)
 Zachary Jaclyn LABOR, MD   No chief complaint on file.   HPI:      Jaclyn Kelly is a 23 y.o. [id number] whose LMP was No LMP recorded., presents today for *** 3/22 with IMPRESSION: 1. Complete resolution of previously visualized hypervascular right cornual region uterine mass. 2. Persistent focal segmental adenomyosis in the anterior upper uterine body, mildly improved since 10/20/2019 MRI. No uterine fibroids. 3. Small 1.4 cm right adnexal peritubal hemorrhagic focus without enhancement, potentially a small endometrioma. Suggest pelvic ultrasound or MRI pelvis without and with IV contrast follow-up in 3-6 months. [place]  for suspected adenomyosis (noted on MRI)   Patient Active Problem List   Diagnosis Date Noted   Adenomyosis 08/11/2020   Drug overdose 06/14/2020   Opiate abuse, continuous (HCC) 06/14/2020   Dysfunctional uterine bleeding 10/28/2019   Uterine mass 10/28/2019   Cesarean delivery delivered 11/22/2018   History of cesarean delivery 06/16/2018   Bipolar affective disorder in remission (HCC) 08/19/2016   Severe episode of recurrent major depressive disorder, without psychotic features (HCC)    Insomnia 06/27/2015   MDD (major depressive disorder) 06/25/2015   Moderate episode of recurrent major depressive disorder (HCC) 04/23/2015   History of trauma 04/11/2015   Miscarriage 04/11/2015   Polysubstance dependence in early, early partial, sustained full, or sustained partial remission (HCC) 04/11/2015   Recurrent major depressive disorder, in full remission (HCC) 04/11/2015   OSA (obstructive sleep apnea) 09/19/2014   Anxiety 09/18/2014   Chronic pain of right ankle 09/18/2014   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/18/2014   Constipation 09/15/2014   Chronic tonsillitis 12/02/2013   Cervical rib 02/17/2012   Hereditary and idiopathic peripheral neuropathy 02/17/2012    Past Surgical History:  Procedure Laterality Date    CESAREAN SECTION N/A 02/09/2017   Procedure: CESAREAN SECTION;  Surgeon: Janit Alm Agent, MD;  Location: [facility];  Service: Obstetrics;  Laterality: N/A;   CESAREAN SECTION N/A 11/22/2018   Procedure: CESAREAN SECTION REPEAT;  Surgeon: Janit Alm Agent, MD;  Location: [facility];  Service: Obstetrics;  Laterality: N/A;   DILATION AND CURETTAGE OF UTERUS N/A 10/26/2020   Procedure: SUCTION DILATATION AND CURETTAGE;  Surgeon: Connell Davies, MD;  Location: [facility];  Service: Gynecology;  Laterality: N/A;   DILATION AND EVACUATION N/A 08/10/2019   Procedure: DILATATION AND EVACUATION;  Surgeon: Janit Alm Agent, MD;  Location: [facility];  Service: Gynecology;  Laterality: N/A;   EMBOLIZATION (CATH LAB) N/A 11/03/2019   Procedure: EMBOLIZATION;  Surgeon: Marea Selinda GORMAN, MD;  Location: [facility];  Service: Cardiovascular;  Laterality: N/A;   FIRST RIB REMOVAL     TONSILLECTOMY      Family History  Problem Relation Age of Onset   Hypertension Father    Stroke Father    Heart failure Father    Healthy Mother     Social History   Socioeconomic History   Marital status: Single    Spouse name: Not on file   Number of children: Not on file   Years of education: Not on file   Highest education level: Not on file  Occupational History   Not on file  Tobacco Use   Smoking status: Never   Smokeless tobacco: Never  Vaping Use   Vaping status: Every Day   Substances: Nicotine , Flavoring  Substance and Sexual Activity   Alcohol use: No   Drug use: Yes    Types: Cocaine    Comment: fent/  perocet   Sexual activity: Yes    Birth control/protection: None  Other Topics Concern   Not on file  Social History Narrative   Not on file   Social Drivers of Health   Financial Resource Strain: Not on file  Food Insecurity: Not on file  Transportation Needs: Not on file  Physical Activity: Not on file  Stress: Not on file  Social Connections: Not on file  Intimate Partner  Violence: Not on file    Outpatient Medications Prior to Visit  Medication Sig Dispense Refill   acetaminophen  (TYLENOL ) 500 MG tablet Take 1,500-2,000 mg by mouth every 6 (six) hours as needed (pain.).     Aspirin-Salicylamide-Caffeine  (BC FAST PAIN RELIEF) 650-195-33.3 MG PACK Take 1 packet by mouth every 8 (eight) hours as needed (pain.).     desvenlafaxine  (PRISTIQ ) 100 MG 24 hr tablet Take 2 tablets (200 mg total) by mouth daily. (Patient not taking: Reported on 10/02/2021) 30 tablet 3   desvenlafaxine  (PRISTIQ ) 50 MG 24 hr tablet Take 50 mg by mouth daily.     ibuprofen  (ADVIL ) 600 MG tablet Take 1 tablet (600 mg total) by mouth every 6 (six) hours as needed. 60 tablet 0   naloxone  (NARCAN ) nasal spray 4 mg/0.1 mL For use with opioid overdose 1 each 1   No facility-administered medications prior to visit.      ROS:  Review of Systems BREAST: No symptoms   OBJECTIVE:   Vitals:  There were no vitals taken for this visit.  Physical Exam  Results: No results found for this or any previous visit (from the past 24 hours).   Assessment/Plan: No diagnosis found.    No orders of the defined types were placed in this encounter.     No follow-ups on file.  Jaclyn Bribiesca B. Suki Crockett, PA-C 08/30/2023 6:00 PM

## 2023-08-31 ENCOUNTER — Ambulatory Visit: Payer: MEDICAID | Admitting: Obstetrics and Gynecology

## 2023-09-16 ENCOUNTER — Ambulatory Visit
Admission: EM | Admit: 2023-09-16 | Discharge: 2023-09-16 | Disposition: A | Discharge status: Home / Self Care | Payer: MEDICAID | Attending: Emergency Medicine | Admitting: Emergency Medicine

## 2023-09-16 ENCOUNTER — Encounter: Payer: Self-pay | Admitting: Emergency Medicine

## 2023-09-16 DIAGNOSIS — N898 Other specified noninflammatory disorders of vagina: Secondary | ICD-10-CM | POA: Diagnosis not present

## 2023-09-16 DIAGNOSIS — H109 Unspecified conjunctivitis: Secondary | ICD-10-CM | POA: Diagnosis present

## 2023-09-16 MED ORDER — MOXIFLOXACIN HCL 0.5 % OP SOLN: 1.0000 [drp] | Freq: Three times a day (TID) | OPHTHALMIC | 0 refills | Status: AC

## 2023-09-16 MED ORDER — METRONIDAZOLE 500 MG PO TABS: 500.0000 mg | ORAL_TABLET | Freq: Two times a day (BID) | ORAL | 0 refills | Status: DC

## 2023-09-16 NOTE — ED Provider Notes (Signed)
 [name]    CSN: [id number] Arrival date & time: 09/16/23  1155      History   Chief Complaint Chief Complaint  Patient presents with   Vaginal Discharge    HPI Jaclyn Kelly is a 23 y.o. female.   Patient presents for evaluation of Jaclyn Kelly vaginal discharge with odor present for 7 days.  Endorses a history of reoccurring bacterial vaginosis.  Has not attempted treatment.  Denies abdominal or flank pain, vaginal itching or irritation, urinary symptoms.  Last menstrual period 09/07/2023.  Patient endorses redness, itching and green purulent drainage with crusting present to the right eye beginning 3 days ago.  Endorses that she got lash extensions this to 1 week ago and removed them due to the becoming irritated.  Denies injury or trauma, decreased vision or light sensitivity.  Does not use contacts.  Past Medical History:  Diagnosis Date   Anxiety    Depression    GERD (gastroesophageal reflux disease)    NO MEDS   Irritable bowel syndrome (IBS) since childhood   Mental disorder    Bipolar/ Manic Depression   Pinched nerve ongoing   in foot, with inflammation   Thoracic outlet syndrome     Patient Active Problem List   Diagnosis Date Noted   Adenomyosis 08/11/2020   Drug overdose 06/14/2020   Opiate abuse, continuous (HCC) 06/14/2020   Dysfunctional uterine bleeding 10/28/2019   Uterine mass 10/28/2019   Cesarean delivery delivered 11/22/2018   History of cesarean delivery 06/16/2018   Bipolar affective disorder in remission (HCC) 08/19/2016   Severe episode of recurrent major depressive disorder, without psychotic features (HCC)    Insomnia 06/27/2015   MDD (major depressive disorder) 06/25/2015   Moderate episode of recurrent major depressive disorder (HCC) 04/23/2015   History of trauma 04/11/2015   Miscarriage 04/11/2015   Polysubstance dependence in early, early partial, sustained full, or sustained partial remission (HCC) 04/11/2015    Recurrent major depressive disorder, in full remission (HCC) 04/11/2015   OSA (obstructive sleep apnea) 09/19/2014   Anxiety 09/18/2014   Chronic pain of right ankle 09/18/2014   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/18/2014   Constipation 09/15/2014   Chronic tonsillitis 12/02/2013   Cervical rib 02/17/2012   Hereditary and idiopathic peripheral neuropathy 02/17/2012    Past Surgical History:  Procedure Laterality Date   CESAREAN SECTION N/A 02/09/2017   Procedure: CESAREAN SECTION;  Surgeon: Janit Alm Agent, MD;  Location: [facility];  Service: Obstetrics;  Laterality: N/A;   CESAREAN SECTION N/A 11/22/2018   Procedure: CESAREAN SECTION REPEAT;  Surgeon: Janit Alm Agent, MD;  Location: [facility];  Service: Obstetrics;  Laterality: N/A;   DILATION AND CURETTAGE OF UTERUS N/A 10/26/2020   Procedure: SUCTION DILATATION AND CURETTAGE;  Surgeon: Connell Davies, MD;  Location: [facility];  Service: Gynecology;  Laterality: N/A;   DILATION AND EVACUATION N/A 08/10/2019   Procedure: DILATATION AND EVACUATION;  Surgeon: Janit Alm Agent, MD;  Location: [facility];  Service: Gynecology;  Laterality: N/A;   EMBOLIZATION (CATH LAB) N/A 11/03/2019   Procedure: EMBOLIZATION;  Surgeon: Marea Selinda GORMAN, MD;  Location: [facility];  Service: Cardiovascular;  Laterality: N/A;   FIRST RIB REMOVAL     TONSILLECTOMY      OB History     Gravida  6   Para  2   Term  2   Preterm  0   AB  3   Living  2  SAB  1   IAB  1   Ectopic  1   Multiple  1   Live Births  2            Home Medications    Prior to Admission medications   Medication Sig Start Date End Date Taking? Authorizing Provider  metroNIDAZOLE  (FLAGYL ) 500 MG tablet Take 1 tablet (500 mg total) by mouth 2 (two) times daily. 09/16/23  Yes Jamilia Jacques R, NP  moxifloxacin  (VIGAMOX ) 0.5 % ophthalmic solution Place 1 drop into the right eye 3 (three) times daily. 09/16/23  Yes Tynika Luddy R,  NP  acetaminophen  (TYLENOL ) 500 MG tablet Take 1,500-2,000 mg by mouth every 6 (six) hours as needed (pain.).    [provider]  Aspirin-Salicylamide-Caffeine  (BC FAST PAIN RELIEF) 650-195-33.3 MG PACK Take 1 packet by mouth every 8 (eight) hours as needed (pain.).    [provider]  desvenlafaxine  (PRISTIQ ) 100 MG 24 hr tablet Take 2 tablets (200 mg total) by mouth daily. Patient not taking: Reported on 10/02/2021 06/14/20   Kasa, Kurian, MD  desvenlafaxine  (PRISTIQ ) 50 MG 24 hr tablet Take 50 mg by mouth daily. 08/06/21   [provider]  ibuprofen  (ADVIL ) 600 MG tablet Take 1 tablet (600 mg total) by mouth every 6 (six) hours as needed. 10/26/20   Connell Davies, MD  naloxone  (NARCAN ) nasal spray 4 mg/0.1 mL For use with opioid overdose 10/02/21   Floy Roberts, MD    Family History Family History  Problem Relation Age of Onset   Hypertension Father    Stroke Father    Heart failure Father    Healthy Mother     Social History Social History   Tobacco Use   Smoking status: Never   Smokeless tobacco: Never  Vaping Use   Vaping status: Every Day   Substances: Nicotine , Flavoring  Substance Use Topics   Alcohol use: No   Drug use: Yes    Types: Cocaine    Comment: fent/ perocet     Allergies   Vicodin [hydrocodone-acetaminophen ], Amoxicillin, and Tramadol    Review of Systems Review of Systems   Physical Exam Triage Vital Signs ED Triage Vitals  Encounter Vitals Group     BP 09/16/23 1310 113/77     Girls Systolic BP Percentile --      Girls Diastolic BP Percentile --      Boys Systolic BP Percentile --      Boys Diastolic BP Percentile --      Pulse Rate 09/16/23 1310 74     Resp 09/16/23 1310 18     Temp 09/16/23 1310 98.6 F (37 C)     Temp Source 09/16/23 1310 Oral     SpO2 09/16/23 1310 99 %     Weight --      Height --      Head Circumference --      Peak Flow --      Pain Score 09/16/23 1306 0     Pain Loc --      Pain  Education --      Exclude from Growth Chart --    No data found.  Updated Vital Signs BP 113/77 (BP Location: Left Arm)   Pulse 74   Temp 98.6 F (37 C) (Oral)   Resp 18   LMP 09/07/2023 (Approximate)   SpO2 99%   Visual Acuity Right Eye Distance: 20/20 Left Eye Distance: 20/20 Bilateral Distance: 20/20  Right Eye Near:  Left Eye Near:    Bilateral Near:     Physical Exam Constitutional:      Appearance: Normal appearance.  Eyes:     Comments: Erythema present to the right conjunctiva, scant drainage on the upper lash line, vision grossly intact, ocular movements intact  Pulmonary:     Effort: Pulmonary effort is normal.  Abdominal:     Tenderness: There is no abdominal tenderness. There is no right CVA tenderness, left CVA tenderness or guarding.  Neurological:     Mental Status: She is alert and oriented to person, place, and time. Mental status is at baseline.      UC Treatments / Results  Labs (all labs ordered are listed, but only abnormal results are displayed) Labs Reviewed  CERVICOVAGINAL ANCILLARY ONLY    EKG   Radiology No results found.  Procedures Procedures (including critical care time)  Medications Ordered in UC Medications - No data to display  Initial Impression / Assessment and Plan / UC Course  I have reviewed the triage vital signs and the nursing notes.  Pertinent labs & imaging results that were available during my care of the patient were reviewed by me and considered in my medical decision making (see chart for details).  Vaginal discharge, bacterial conjunctivitis of right eye  Treating empirically for BV due to symptomology and history not as all prescribed advised abstaining from alcohol during treatment, STI labs pending will treat per protocol, advised abstinence until lab results, and/or treatment is complete, advised condom use during all sexual encounters moving, may follow-up with urgent care as needed    Patient is  symptomology consistent with above diagnoses, discussed with patient, prescribed moxifloxacin  and recommended nonpharmacological supportive care with follow-up as needed Final Clinical Impressions(s) / UC Diagnoses   Final diagnoses:  Vaginal discharge  Bacterial conjunctivitis of right eye     Discharge Instructions      Today you are being treated for  Bacterial vaginosis   Take Metronidazole  500 mg twice a day for 7 days, do not drink alcohol while using medication, this will make you feel sick   Bacterial vaginosis which results from an overgrowth of one on several organisms that are normally present in your vagina. Vaginosis is an inflammation of the vagina that can result in discharge, itching and pain.  Labs pending 2-3 days, you will be contacted if positive for any sti and treatment will be sent to the pharmacy, you will have to return to the clinic if positive for gonorrhea to receive treatment   Please refrain from having sex until labs results, if positive please refrain from having sex until treatment complete and symptoms resolve   If positive for  Chlamydia  gonorrhea or trichomoniasis please notify partner or partners so they may tested as well  Moving forward, it is recommended you use some form of protection against the transmission of sti infections  such as condoms or dental dams with each sexual encounter     In addition: Avoid baths, hot tubs and whirlpool spas.  Don't use scented or harsh soaps Avoid irritants. These include scented tampons and pads. Wipe from front to back after using the toilet. Don't douche. Your vagina doesn't require cleansing other than normal bathing.  Use a condom.  Wear cotton underwear, this fabric absorbs some moisture.     Today you being treated for bacterial conjunctivitis.   Place one drop of moxifloxacin  into the effected eye every 8 hours while awake for 7 days.  If the other eye starts to have symptoms you may use  medication in it as well. Do not allow tip of dropper to touch eye.  May use cool compress for comfort and to remove discharge if present. Pat the eye, do not wipe.  Do not rub eyes, this may cause more irritation.  May use benadryl  as needed to help if itching present.  Please avoid use of eye makeup until symptoms clear.  If symptoms persist after use of medication, please follow up at Urgent Care or with ophthalmologist (eye doctor)    ED Prescriptions     Medication Sig Dispense Auth. Provider   metroNIDAZOLE  (FLAGYL ) 500 MG tablet Take 1 tablet (500 mg total) by mouth 2 (two) times daily. 14 tablet Breniyah Romm R, NP   moxifloxacin  (VIGAMOX ) 0.5 % ophthalmic solution Place 1 drop into the right eye 3 (three) times daily. 3 mL Teresa Shelba SAUNDERS, NP      PDMP not reviewed this encounter.   Teresa Shelba SAUNDERS, NP 09/16/23 1340

## 2023-09-16 NOTE — ED Triage Notes (Signed)
 Patient reports white vaginal discharged with odor x 1 week.  Patient also complains of redness to right eye x 3 days.

## 2023-09-16 NOTE — Discharge Instructions (Addendum)
 Today you are being treated for  Bacterial vaginosis   Take Metronidazole  500 mg twice a day for 7 days, do not drink alcohol while using medication, this will make you feel sick   Bacterial vaginosis which results from an overgrowth of one on several organisms that are normally present in your vagina. Vaginosis is an inflammation of the vagina that can result in discharge, itching and pain.  Labs pending 2-3 days, you will be contacted if positive for any sti and treatment will be sent to the pharmacy, you will have to return to the clinic if positive for gonorrhea to receive treatment   Please refrain from having sex until labs results, if positive please refrain from having sex until treatment complete and symptoms resolve   If positive for  Chlamydia  gonorrhea or trichomoniasis please notify partner or partners so they may tested as well  Moving forward, it is recommended you use some form of protection against the transmission of sti infections  such as condoms or dental dams with each sexual encounter     In addition: Avoid baths, hot tubs and whirlpool spas.  Don't use scented or harsh soaps Avoid irritants. These include scented tampons and pads. Wipe from front to back after using the toilet. Don't douche. Your vagina doesn't require cleansing other than normal bathing.  Use a condom.  Wear cotton underwear, this fabric absorbs some moisture.     Today you being treated for bacterial conjunctivitis.   Place one drop of moxifloxacin  into the effected eye every 8 hours while awake for 7 days. If the other eye starts to have symptoms you may use medication in it as well. Do not allow tip of dropper to touch eye.  May use cool compress for comfort and to remove discharge if present. Pat the eye, do not wipe.  Do not rub eyes, this may cause more irritation.  May use benadryl  as needed to help if itching present.  Please avoid use of eye makeup until symptoms clear.  If  symptoms persist after use of medication, please follow up at Urgent Care or with ophthalmologist (eye doctor)

## 2023-09-17 LAB — CERVICOVAGINAL ANCILLARY ONLY
Bacterial Vaginitis (gardnerella): NEGATIVE
Candida Glabrata: NEGATIVE
Candida Vaginitis: NEGATIVE
Chlamydia: NEGATIVE
Comment: NEGATIVE
Comment: NEGATIVE
Comment: NEGATIVE
Comment: NEGATIVE
Comment: NEGATIVE
Comment: NORMAL
Neisseria Gonorrhea: NEGATIVE
Trichomonas: NEGATIVE

## 2023-11-16 ENCOUNTER — Ambulatory Visit
Admission: EM | Admit: 2023-11-16 | Discharge: 2023-11-16 | Disposition: A | Discharge status: Home / Self Care | Payer: MEDICAID | Attending: Emergency Medicine | Admitting: Emergency Medicine

## 2023-11-16 DIAGNOSIS — N898 Other specified noninflammatory disorders of vagina: Secondary | ICD-10-CM | POA: Insufficient documentation

## 2023-11-16 HISTORY — DX: Bipolar disorder, unspecified: F31.9

## 2023-11-16 MED ORDER — METRONIDAZOLE 500 MG PO TABS: 500.0000 mg | ORAL_TABLET | Freq: Two times a day (BID) | ORAL | 0 refills | Status: AC

## 2023-11-16 NOTE — ED Triage Notes (Signed)
 Pt presents with vaginal odor and discharge. Denies rash, concern for STD. States she has had BV in the past and this is similar. Does want the STD testing on the swab.

## 2023-11-16 NOTE — ED Provider Notes (Signed)
 [name]    CSN: [id number] Arrival date & time: 11/16/23  1008      History   Chief Complaint Chief Complaint  Patient presents with   Vaginal Discharge    HPI Jaclyn Kelly is a 23 y.o. female.   Patient presents for evaluation of a [name] vaginal with odor for 3 days.  Endorses a history of reoccurring BV.  Has not attempted treatment.  No known exposure.  Currently menstruating.  Denies vaginal itching, irritation or urinary symptoms, abdominal or flank pain.  Past Medical History:  Diagnosis Date   Anxiety    Bipolar affective disorder (HCC)    Depression    GERD (gastroesophageal reflux disease)    NO MEDS   Irritable bowel syndrome (IBS) since childhood   Mental disorder    Bipolar/ Manic Depression   Pinched nerve ongoing   in foot, with inflammation   Thoracic outlet syndrome     Patient Active Problem List   Diagnosis Date Noted   Adenomyosis 08/11/2020   Drug overdose 06/14/2020   Opiate abuse, continuous (HCC) 06/14/2020   Dysfunctional uterine bleeding 10/28/2019   Uterine mass 10/28/2019   Cesarean delivery delivered 11/22/2018   History of cesarean delivery 06/16/2018   Bipolar affective disorder in remission 08/19/2016   Severe episode of recurrent major depressive disorder, without psychotic features (HCC)    Insomnia 06/27/2015   MDD (major depressive disorder) 06/25/2015   Moderate episode of recurrent major depressive disorder (HCC) 04/23/2015   History of trauma 04/11/2015   Miscarriage 04/11/2015   Polysubstance dependence in early, early partial, sustained full, or sustained partial remission (HCC) 04/11/2015   Recurrent major depressive disorder, in full remission 04/11/2015   OSA (obstructive sleep apnea) 09/19/2014   Anxiety 09/18/2014   Chronic pain of right ankle 09/18/2014   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/18/2014   Constipation 09/15/2014   Chronic tonsillitis 12/02/2013   Cervical rib  02/17/2012   Hereditary and idiopathic peripheral neuropathy 02/17/2012    Past Surgical History:  Procedure Laterality Date   CESAREAN SECTION N/A 02/09/2017   Procedure: CESAREAN SECTION;  Surgeon: Janit Alm Agent, MD;  Location: [facility];  Service: Obstetrics;  Laterality: N/A;   CESAREAN SECTION N/A 11/22/2018   Procedure: CESAREAN SECTION REPEAT;  Surgeon: Janit Alm Agent, MD;  Location: [facility];  Service: Obstetrics;  Laterality: N/A;   DILATION AND CURETTAGE OF UTERUS N/A 10/26/2020   Procedure: SUCTION DILATATION AND CURETTAGE;  Surgeon: Connell Davies, MD;  Location: [facility];  Service: Gynecology;  Laterality: N/A;   DILATION AND EVACUATION N/A 08/10/2019   Procedure: DILATATION AND EVACUATION;  Surgeon: Janit Alm Agent, MD;  Location: [facility];  Service: Gynecology;  Laterality: N/A;   EMBOLIZATION (CATH LAB) N/A 11/03/2019   Procedure: EMBOLIZATION;  Surgeon: Marea Selinda GORMAN, MD;  Location: [facility];  Service: Cardiovascular;  Laterality: N/A;   FIRST RIB REMOVAL     TONSILLECTOMY      OB History     Gravida  6   Para  2   Term  2   Preterm  0   AB  3   Living  2      SAB  1   IAB  1   Ectopic  1   Multiple  1   Live Births  2            Home Medications    Prior to Admission medications   Medication  Sig Start Date End Date Taking? Authorizing Provider  acetaminophen  (TYLENOL ) 500 MG tablet Take 1,500-2,000 mg by mouth every 6 (six) hours as needed (pain.).   Yes [provider]  Aspirin-Salicylamide-Caffeine  (BC FAST PAIN RELIEF) 650-195-33.3 MG PACK Take 1 packet by mouth every 8 (eight) hours as needed (pain.).   Yes [provider]  desvenlafaxine  (PRISTIQ ) 50 MG 24 hr tablet Take 50 mg by mouth daily. 08/06/21  Yes [provider]  ibuprofen  (ADVIL ) 600 MG tablet Take 1 tablet (600 mg total) by mouth every 6 (six) hours as needed. 10/26/20  Yes Connell Davies, MD  metroNIDAZOLE  (FLAGYL ) 500 MG tablet  Take 1 tablet (500 mg total) by mouth 2 (two) times daily. 11/16/23  Yes Arrie Zuercher R, NP  QUEtiapine (SEROQUEL) 100 MG tablet Take 100 mg by mouth at bedtime.   Yes [provider]  desvenlafaxine  (PRISTIQ ) 100 MG 24 hr tablet Take 2 tablets (200 mg total) by mouth daily. 06/14/20   Kasa, Kurian, MD  moxifloxacin  (VIGAMOX ) 0.5 % ophthalmic solution Place 1 drop into the right eye 3 (three) times daily. 09/16/23   Teresa Shelba SAUNDERS, NP  naloxone  (NARCAN ) nasal spray 4 mg/0.1 mL For use with opioid overdose 10/02/21   Floy Roberts, MD    Family History Family History  Problem Relation Age of Onset   Hypertension Father    Stroke Father    Heart failure Father    Healthy Mother     Social History Social History   Tobacco Use   Smoking status: Never   Smokeless tobacco: Never  Vaping Use   Vaping status: Every Day   Substances: Nicotine , Flavoring  Substance Use Topics   Alcohol use: No   Drug use: Not Currently    Types: Cocaine    Comment: fent/ perocet     Allergies   Vicodin [hydrocodone-acetaminophen ], Amoxicillin, and Tramadol    Review of Systems Review of Systems   Physical Exam Triage Vital Signs ED Triage Vitals  Encounter Vitals Group     BP 11/16/23 1055 120/86     Girls Systolic BP Percentile --      Girls Diastolic BP Percentile --      Boys Systolic BP Percentile --      Boys Diastolic BP Percentile --      Pulse Rate 11/16/23 1055 85     Resp 11/16/23 1055 16     Temp 11/16/23 1055 98.5 F (36.9 C)     Temp Source 11/16/23 1055 Oral     SpO2 11/16/23 1055 96 %     Weight 11/16/23 1101 149 lb (67.6 kg)     Height --      Head Circumference --      Peak Flow --      Pain Score 11/16/23 1101 0     Pain Loc --      Pain Education --      Exclude from Growth Chart --    No data found.  Updated Vital Signs BP 120/86 (BP Location: Left Arm)   Pulse 85   Temp 98.5 F (36.9 C) (Oral)   Resp 16   Wt 149 lb (67.6 kg)   LMP  11/16/2023   SpO2 96%   BMI 27.25 kg/m   Visual Acuity Right Eye Distance:   Left Eye Distance:   Bilateral Distance:    Right Eye Near:   Left Eye Near:    Bilateral Near:     Physical Exam Constitutional:  Appearance: Normal appearance.  Eyes:     Extraocular Movements: Extraocular movements intact.  Pulmonary:     Effort: Pulmonary effort is normal.  Abdominal:     Tenderness: There is no abdominal tenderness. There is no right CVA tenderness, left CVA tenderness or guarding.  Neurological:     Mental Status: She is alert and oriented to person, place, and time.      UC Treatments / Results  Labs (all labs ordered are listed, but only abnormal results are displayed) Labs Reviewed  CERVICOVAGINAL ANCILLARY ONLY    EKG   Radiology No results found.  Procedures Procedures (including critical care time)  Medications Ordered in UC Medications - No data to display  Initial Impression / Assessment and Plan / UC Course  I have reviewed the triage vital signs and the nursing notes.  Pertinent labs & imaging results that were available during my care of the patient were reviewed by me and considered in my medical decision making (see chart for details).  Vaginal discharge  Treating BV empirically, prescribed metronidazole , advised abstaining from alcohol and sex during treatment, declined HIV and syphilis testing,STI labs pending will treat per protocol, advised abstinence until lab results, and/or treatment is complete, advised condom use during all sexual encounters moving, may follow-up with urgent care as needed  Final Clinical Impressions(s) / UC Diagnoses   Final diagnoses:  Vaginal discharge     Discharge Instructions      Today you are being treated prophylactically for  Bacterial vaginosis   Take Metronidazole  500 mg twice a day for 7 days, do not drink alcohol while using medication, this will make you feel sick   Bacterial vaginosis which  results from an overgrowth of one on several organisms that are normally present in your vagina. Vaginosis is an inflammation of the vagina that can result in discharge, itching and pain.  Labs pending 2-3 days, you will be contacted if positive for any sti and treatment will be sent to the pharmacy, you will have to return to the clinic if positive for gonorrhea to receive treatment   Please refrain from having sex until labs results, if positive please refrain from having sex until treatment complete and symptoms resolve   If positive for  Chlamydia  gonorrhea or trichomoniasis please notify partner or partners so they may tested as well   it is recommended you use some form of protection against the transmission of sti infections  such as condoms or dental dams with each sexual encounter     In addition: Avoid baths, hot tubs and whirlpool spas.  Don't use scented or harsh soaps Avoid irritants. These include scented tampons and pads. Wipe from front to back after using the toilet. Don't douche. Your vagina doesn't require cleansing other than normal bathing.  Use a condom.  Wear cotton underwear, this fabric absorbs some moisture.        ED Prescriptions     Medication Sig Dispense Auth. Provider   metroNIDAZOLE  (FLAGYL ) 500 MG tablet Take 1 tablet (500 mg total) by mouth 2 (two) times daily. 14 tablet Jearline Hirschhorn R, NP      PDMP not reviewed this encounter.   Teresa Shelba SAUNDERS, NP 11/16/23 [phone]

## 2023-11-16 NOTE — Discharge Instructions (Addendum)
 Today you are being treated prophylactically for  Bacterial vaginosis   Take Metronidazole  500 mg twice a day for 7 days, do not drink alcohol while using medication, this will make you feel sick   Bacterial vaginosis which results from an overgrowth of one on several organisms that are normally present in your vagina. Vaginosis is an inflammation of the vagina that can result in discharge, itching and pain.  Labs pending 2-3 days, you will be contacted if positive for any sti and treatment will be sent to the pharmacy, you will have to return to the clinic if positive for gonorrhea to receive treatment   Please refrain from having sex until labs results, if positive please refrain from having sex until treatment complete and symptoms resolve   If positive for  Chlamydia  gonorrhea or trichomoniasis please notify partner or partners so they may tested as well   it is recommended you use some form of protection against the transmission of sti infections  such as condoms or dental dams with each sexual encounter     In addition: Avoid baths, hot tubs and whirlpool spas.  Don't use scented or harsh soaps Avoid irritants. These include scented tampons and pads. Wipe from front to back after using the toilet. Don't douche. Your vagina doesn't require cleansing other than normal bathing.  Use a condom.  Wear cotton underwear, this fabric absorbs some moisture.

## 2023-11-17 ENCOUNTER — Ambulatory Visit: Payer: Self-pay | Admitting: Emergency Medicine

## 2023-11-17 LAB — CERVICOVAGINAL ANCILLARY ONLY
Bacterial Vaginitis (gardnerella): POSITIVE — AB
Candida Glabrata: NEGATIVE
Candida Vaginitis: NEGATIVE
Chlamydia: NEGATIVE
Comment: NEGATIVE
Comment: NEGATIVE
Comment: NEGATIVE
Comment: NEGATIVE
Comment: NEGATIVE
Comment: NORMAL
Neisseria Gonorrhea: NEGATIVE
Trichomonas: NEGATIVE
